# Patient Record
Sex: Male | Born: 1960
Health system: Southern US, Community
[De-identification: ages and names within clinical notes are randomized; demographics above are authoritative.]

## PROBLEM LIST (undated history)

## (undated) DIAGNOSIS — I10 Essential (primary) hypertension: Secondary | ICD-10-CM

## (undated) HISTORY — PX: NO PAST SURGERIES: SHX2092

---

## 2011-06-30 ENCOUNTER — Emergency Department (HOSPITAL_COMMUNITY): Payer: Self-pay

## 2011-06-30 ENCOUNTER — Encounter (HOSPITAL_COMMUNITY): Payer: Self-pay | Admitting: *Deleted

## 2011-06-30 ENCOUNTER — Inpatient Hospital Stay (HOSPITAL_COMMUNITY)
Admission: EM | Admit: 2011-06-30 | Discharge: 2011-07-02 | DRG: 683 | Disposition: A | Discharge status: Home / Self Care | Payer: Self-pay | Attending: Internal Medicine | Admitting: Internal Medicine

## 2011-06-30 DIAGNOSIS — I5022 Chronic systolic (congestive) heart failure: Secondary | ICD-10-CM | POA: Diagnosis present

## 2011-06-30 DIAGNOSIS — R0989 Other specified symptoms and signs involving the circulatory and respiratory systems: Secondary | ICD-10-CM

## 2011-06-30 DIAGNOSIS — R05 Cough: Secondary | ICD-10-CM | POA: Diagnosis present

## 2011-06-30 DIAGNOSIS — R7309 Other abnormal glucose: Secondary | ICD-10-CM | POA: Diagnosis present

## 2011-06-30 DIAGNOSIS — I5023 Acute on chronic systolic (congestive) heart failure: Secondary | ICD-10-CM | POA: Diagnosis present

## 2011-06-30 DIAGNOSIS — N289 Disorder of kidney and ureter, unspecified: Secondary | ICD-10-CM

## 2011-06-30 DIAGNOSIS — I10 Essential (primary) hypertension: Secondary | ICD-10-CM

## 2011-06-30 DIAGNOSIS — R739 Hyperglycemia, unspecified: Secondary | ICD-10-CM | POA: Diagnosis present

## 2011-06-30 DIAGNOSIS — R059 Cough, unspecified: Secondary | ICD-10-CM | POA: Diagnosis present

## 2011-06-30 DIAGNOSIS — R748 Abnormal levels of other serum enzymes: Secondary | ICD-10-CM | POA: Diagnosis present

## 2011-06-30 DIAGNOSIS — E860 Dehydration: Secondary | ICD-10-CM

## 2011-06-30 DIAGNOSIS — Z6841 Body Mass Index (BMI) 40.0 and over, adult: Secondary | ICD-10-CM

## 2011-06-30 DIAGNOSIS — N183 Chronic kidney disease, stage 3 unspecified: Secondary | ICD-10-CM | POA: Diagnosis present

## 2011-06-30 DIAGNOSIS — I509 Heart failure, unspecified: Secondary | ICD-10-CM | POA: Diagnosis present

## 2011-06-30 DIAGNOSIS — E785 Hyperlipidemia, unspecified: Secondary | ICD-10-CM | POA: Diagnosis present

## 2011-06-30 DIAGNOSIS — I129 Hypertensive chronic kidney disease with stage 1 through stage 4 chronic kidney disease, or unspecified chronic kidney disease: Principal | ICD-10-CM | POA: Diagnosis present

## 2011-06-30 HISTORY — DX: Essential (primary) hypertension: I10

## 2011-06-30 LAB — CBC
MCH: 29.3 pg (ref 26.0–34.0)
MCH: 29.2 pg (ref 26.0–34.0)
MCHC: 34.2 g/dL (ref 30.0–36.0)
MCHC: 34 g/dL (ref 30.0–36.0)
Platelets: 154 10*3/uL (ref 150–400)
Platelets: 160 10*3/uL (ref 150–400)
RDW: 14 % (ref 11.5–15.5)
RDW: 13.9 % (ref 11.5–15.5)

## 2011-06-30 LAB — BASIC METABOLIC PANEL
Calcium: 9.5 mg/dL (ref 8.4–10.5)
GFR calc Af Amer: 37 mL/min — ABNORMAL LOW (ref >90)
GFR calc non Af Amer: 32 mL/min — ABNORMAL LOW (ref >90)
Glucose, Bld: 144 mg/dL — ABNORMAL HIGH (ref 70–99)
Sodium: 141 mEq/L (ref 135–145)

## 2011-06-30 LAB — DIFFERENTIAL
Basophils Absolute: 0 10*3/uL (ref 0.0–0.1)
Basophils Relative: 0 % (ref 0–1)
Eosinophils Absolute: 0.2 10*3/uL (ref 0.0–0.7)
Monocytes Relative: 5 % (ref 3–12)
Neutro Abs: 7.8 10*3/uL — ABNORMAL HIGH (ref 1.7–7.7)
Neutrophils Relative %: 72 % (ref 43–77)

## 2011-06-30 LAB — CREATININE, SERUM: Creatinine, Ser: 2.21 mg/dL — ABNORMAL HIGH (ref 0.50–1.35)

## 2011-06-30 LAB — MAGNESIUM: Magnesium: 2 mg/dL (ref 1.5–2.5)

## 2011-06-30 LAB — CARDIAC PANEL(CRET KIN+CKTOT+MB+TROPI): CK, MB: 2.7 ng/mL (ref 0.3–4.0)

## 2011-06-30 MED ORDER — ENALAPRILAT 1.25 MG/ML IV SOLN
1.2500 mg | Freq: Four times a day (QID) | INTRAVENOUS | Status: DC
  Administered 2011-06-30 – 2011-07-01 (×4): 1.25 mg via INTRAVENOUS
  Filled 2011-06-30 (×11): qty 1

## 2011-06-30 MED ORDER — ENOXAPARIN SODIUM 80 MG/0.8ML ~~LOC~~ SOLN
80.0000 mg | SUBCUTANEOUS | Status: DC
  Administered 2011-07-01 – 2011-07-02 (×2): 80 mg via SUBCUTANEOUS
  Filled 2011-06-30 (×2): qty 0.8

## 2011-06-30 MED ORDER — LABETALOL HCL 5 MG/ML IV SOLN
20.0000 mg | INTRAVENOUS | Status: AC
  Administered 2011-06-30: 20 mg via INTRAVENOUS
  Filled 2011-06-30: qty 4

## 2011-06-30 MED ORDER — SUCRALFATE 1 G PO TABS
1.0000 g | ORAL_TABLET | Freq: Three times a day (TID) | ORAL | Status: DC
  Administered 2011-06-30 – 2011-07-02 (×6): 1 g via ORAL
  Filled 2011-06-30 (×9): qty 1

## 2011-06-30 MED ORDER — CLONIDINE HCL 0.1 MG PO TABS
0.1000 mg | ORAL_TABLET | Freq: Three times a day (TID) | ORAL | Status: DC
  Administered 2011-06-30 – 2011-07-02 (×5): 0.1 mg via ORAL
  Filled 2011-06-30 (×7): qty 1

## 2011-06-30 MED ORDER — METOPROLOL TARTRATE 100 MG PO TABS
100.0000 mg | ORAL_TABLET | Freq: Two times a day (BID) | ORAL | Status: DC
  Administered 2011-06-30 – 2011-07-02 (×4): 100 mg via ORAL
  Filled 2011-06-30 (×2): qty 4
  Filled 2011-06-30 (×3): qty 1

## 2011-06-30 MED ORDER — ENOXAPARIN SODIUM 40 MG/0.4ML ~~LOC~~ SOLN: 40.0000 mg | SUBCUTANEOUS | Status: DC

## 2011-06-30 MED ORDER — PANTOPRAZOLE SODIUM 40 MG IV SOLR
40.0000 mg | INTRAVENOUS | Status: DC
  Administered 2011-06-30 – 2011-07-01 (×2): 40 mg via INTRAVENOUS
  Filled 2011-06-30 (×3): qty 40

## 2011-06-30 MED ORDER — SODIUM CHLORIDE 0.9 % IV BOLUS (SEPSIS): 1000.0000 mL | Freq: Once | INTRAVENOUS | Status: DC

## 2011-06-30 NOTE — ED Provider Notes (Signed)
History     CSN: [id number]  Arrival date & time 06/30/11  [id number]   First MD Initiated Contact with Patient 06/30/11 1009      Chief Complaint  Patient presents with  . Cough    x3-68months    (Consider location/radiation/quality/duration/timing/severity/associated sxs/prior treatment) HPI  Pt presents to the Ed with complaints of a cough. Pt states he has had hypertension but has not taken medication in years because he has been unemployed. The patient denies CP, lower extremity edema, fevers, chills, productive cough, weakness. Or any other related symptoms. Denies hx of kidney or heart problems.  Past Medical History  Diagnosis Date  . Hypertension     Past Surgical History  Procedure Date  . No past surgeries     History reviewed. No pertinent family history.  History  Substance Use Topics  . Smoking status: Never Smoker   . Smokeless tobacco: Never Used  . Alcohol Use: Yes     ocassionally      Review of Systems  All other systems reviewed and are negative.    Allergies  Review of patient's allergies indicates no known allergies.  Home Medications   Current Outpatient Rx  Name Route Sig Dispense Refill  . NAPROXEN SODIUM 220 MG PO TABS Oral Take 220 mg by mouth 2 (two) times daily as needed. For pain.      BP 177/122  Pulse 96  Temp(Src) 98.6 F (37 C) (Oral)  Resp 20  Wt 350 lb (158.759 kg)  SpO2 99%  Physical Exam  Nursing note and vitals reviewed. Constitutional: He is oriented to person, place, and time. He appears well-developed and well-nourished.       Obese male  HENT:  Head: Normocephalic and atraumatic.  Eyes: EOM are normal. Pupils are equal, round, and reactive to light.  Neck: Normal range of motion.  Cardiovascular: Normal rate and regular rhythm.   Pulmonary/Chest: Effort normal and breath sounds normal.  Abdominal: He exhibits no distension. There is no rebound.       Abdominal obesity  Musculoskeletal: Normal range of  motion. He exhibits edema (mild LE edema).  Neurological: He is alert and oriented to person, place, and time.  Skin: Skin is warm and dry.    ED Course  Procedures (including critical care time)  Labs Reviewed  CBC - Abnormal; Notable for the following:    RBC 6.10 (*)    Hemoglobin 17.9 (*)    HCT 52.3 (*)    All other components within normal limits  BASIC METABOLIC PANEL - Abnormal; Notable for the following:    Glucose, Bld 144 (*)    BUN 26 (*)    Creatinine, Ser 2.29 (*)    GFR calc non Af Amer 32 (*)    GFR calc Af Amer 37 (*)    All other components within normal limits  PRO B NATRIURETIC PEPTIDE - Abnormal; Notable for the following:    Pro B Natriuretic peptide (BNP) 1068.0 (*)    All other components within normal limits  PRO B NATRIURETIC PEPTIDE   Dg Chest 2 View  06/30/2011  *RADIOLOGY REPORT*  Clinical Data: Persistent cough for 3-4 months  CHEST - 2 VIEW  Comparison: None  Findings: Enlargement of cardiac silhouette. Pulmonary vascular congestion. Mediastinal contours normal. Low lung volumes with minimal bibasilar atelectasis. Minimal bronchitic changes. No infiltrate, pleural effusion or pneumothorax. Multilevel endplate spur formation thoracic spine.  IMPRESSION: Enlargement of cardiac silhouette with pulmonary vascular congestion. Bronchitic changes  with minimal bibasilar atelectasis.  Original Report Authenticated By: Burnetta Sabin, M.D.     1. Uncontrolled hypertension   2. Renal insufficiency   3. Pulmonary vascular congestion   4. Dehydration       MDM  Pts lab work up shows dehydration, renal insufficiency, uncontrolled hypertension.  I have discussed results with patient and that I recommend he be admitted to the hospital for further evaluation and treatment, especially given that he does not have a PCP and no follow-up. He is reluctant but eventually agreed to stay for a day.  Pt has been admitted to Dr. Jackie Plum, inpatient, medical  bed, Team [place], [place] 06/30/11 1626

## 2011-06-30 NOTE — ED Notes (Signed)
Pt states "have had this cough x 4 months, sometimes the mucus is white or brown, have been unemployed since 01/2010 & haven't taken b/p med since then"

## 2011-06-30 NOTE — ED Notes (Signed)
US tech at bedside with patient

## 2011-06-30 NOTE — H&P (Signed)
PCP:  No primary provider on file. Chief Complaint:  " Cough in my throat of about 3 months duration".  HPI:  Patient is a 51 years old morbidly obese African American male with history of hypertension, has not taken his medication in over1-1/2 years because of being unemployed presenting to the emergency room with cough in his throat of about 3 months in duration. Patient claimed to have some feeling of "something stuck in my throat" and this is made worse especially at night or any hours of the morning. He denies any chest pain. He denies any shortness of breath. He denies any palpitation. He denied any nausea or vomiting. He denies any fever, chills or Rigors. The cough was said to be persistently irritating and subsequently was asked by his girlfriend to come to the hospital to be evaluated.   In the emergency room, patient was found to have a blood pressure of 197/168mmhg, but denies any headaches or blurry vision. Chest x-ray showed slight pulmonary vascular congestion. After evaluating patient, he was advised to be admitted for blood pressure stabilization.  Review of Systems:  The patient denies anorexia, fever, weight loss,, vision loss, decreased hearing, hoarseness, chest pain, syncope, dyspnea on exertion, trace peripheral edema, balance deficits, hemoptysis, abdominal pain, melena, hematochezia, severe indigestion/heartburn, hematuria, incontinence, genital sores, muscle weakness, suspicious skin lesions, transient blindness, difficulty walking, depression, unusual weight change, abnormal bleeding, enlarged lymph nodes, angioedema, and breast masses.  Past Medical History:  Past Medical History  Diagnosis Date  . Hypertension     Past Surgical History  Procedure Date  . No past surgeries     Medications:  Prior to Admission medications   Medication Sig Start Date End Date Taking? Authorizing Provider  naproxen sodium (ANAPROX) 220 MG tablet Take 220 mg by mouth 2 (two)  times daily as needed. For pain.   Yes Historical Provider, MD    Allergies:  No Known Allergies  Social History:   reports that he has never smoked. He has never used smokeless tobacco. He reports that he drinks alcohol. He reports that he does not use illicit drugs.  Family History:  History reviewed. No pertinent family history.  Physical Exam:  Filed Vitals:   06/30/11 1008 06/30/11 1138 06/30/11 1713  BP: 195/129 177/122 197/112  Pulse: 94 96   Temp: 97.9 F (36.6 C) 98.6 F (37 C)   TempSrc: Oral Oral   Resp: 20 20   Weight: 158.759 kg (350 lb)    SpO2: 99% 99%       General: Morbidly obese, Alert and oriented times three, well developed and nourished, no acute distress  Eyes: PERRLA, pink conjunctiva, scleral icterus  ENT: Moist oral mucosa, neck supple, no thyromegaly  Lungs: Decreased breath at the lung bases most likely secondary to body habitus  Cardiovascular: regular rate and rhythm, no regurgitation, no gallops, no murmurs. No carotid bruits, no JVD  Abdomen: soft, positive BS, non-tender, non-distended, no organomegaly, not an acute abdomen  GU: not examined  Neuro: Nonfocal  Musculoskeletal: Unremarkable. Trace pedal edema   Skin: no rash, no subcutaneous crepitation, no decubitus  Psych: appropriate patient  ?  Labs on Admission:   [facility] 06/30/11 1220  NA 141  K 4.4  CL 103  CO2 26  GLUCOSE 144*  BUN 26*  CREATININE 2.29*  CALCIUM 9.5  MG --  PHOS --    No results found for this basename: AST:2,ALT:2,ALKPHOS:2,BILITOT:2,PROT:2,ALBUMIN:2 in the last 72 hours  No results found for  this basename: LIPASE:2,AMYLASE:2 in the last 72 hours   Basename 06/30/11 1220  WBC 9.4  NEUTROABS --  HGB 17.9*  HCT 52.3*  MCV 85.7  PLT 154    No results found for this basename: CKTOTAL:3,CKMB:3,CKMBINDEX:3,TROPONINI:3 in the last 72 hours  No results found for this basename: TSH,T4TOTAL,FREET3,T3FREE,THYROIDAB in the last 72  hours  No results found for this basename: VITAMINB12:2,FOLATE:2,FERRITIN:2,TIBC:2,IRON:2,RETICCTPCT:2 in the last 72 hours  Radiological Exams on Admission:  Dg Chest 2 View  06/30/2011  *RADIOLOGY REPORT*  Clinical Data: Persistent cough for 3-4 months  CHEST - 2 VIEW  Comparison: None  Findings: Enlargement of cardiac silhouette. Pulmonary vascular congestion. Mediastinal contours normal. Low lung volumes with minimal bibasilar atelectasis. Minimal bronchitic changes. No infiltrate, pleural effusion or pneumothorax. Multilevel endplate spur formation thoracic spine.  IMPRESSION: Enlargement of cardiac silhouette with pulmonary vascular congestion. Bronchitic changes with minimal bibasilar atelectasis.  Original Report Authenticated By: Burnetta Sabin, M.D.    Assessment/Plan  Present on Admission:   Problems: #1" irritating cough in the throat" #2 morbid obesity #3 uncontrolled hypertension  Impression:  #1 chronic irritating cough in the throat most likely GERD #2 uncontrolled hypertension #3 morbid obesity #4 renal insufficiency most likely secondary to uncontrolled blood pressure(nephrosclerosis)  Plan: #1 admit patient to general medical floor #2 give patient IV Protonix as well as Carafate for GERD #3 control hypertension with IV Vasotec, by mouth Lopressor as well as clonidine #4 GI prophylaxis with Protonix and DVT prophylaxis with Lovenox #5 labs;Cardiac enzyme every 8x3, lipid panel, thyroid panel, repeat CBC, CMP and magnesium in a.m. #6 imaging; 2-D echo, renal ultrasound. Patient will be followed and evaluated on daily basis Jackie Plum    [phone]

## 2011-07-01 DIAGNOSIS — E785 Hyperlipidemia, unspecified: Secondary | ICD-10-CM | POA: Diagnosis present

## 2011-07-01 DIAGNOSIS — I10 Essential (primary) hypertension: Secondary | ICD-10-CM | POA: Diagnosis present

## 2011-07-01 DIAGNOSIS — R739 Hyperglycemia, unspecified: Secondary | ICD-10-CM | POA: Diagnosis present

## 2011-07-01 DIAGNOSIS — R748 Abnormal levels of other serum enzymes: Secondary | ICD-10-CM | POA: Diagnosis present

## 2011-07-01 LAB — CBC
HCT: 49.4 % (ref 39.0–52.0)
Platelets: 162 10*3/uL (ref 150–400)
RBC: 5.74 MIL/uL (ref 4.22–5.81)
RDW: 14.2 % (ref 11.5–15.5)
WBC: 10.2 10*3/uL (ref 4.0–10.5)

## 2011-07-01 LAB — DIFFERENTIAL
Basophils Absolute: 0 10*3/uL (ref 0.0–0.1)
Lymphocytes Relative: 22 % (ref 12–46)
Lymphs Abs: 2.2 10*3/uL (ref 0.7–4.0)
Monocytes Absolute: 0.7 10*3/uL (ref 0.1–1.0)
Neutro Abs: 6.9 10*3/uL (ref 1.7–7.7)

## 2011-07-01 LAB — COMPREHENSIVE METABOLIC PANEL
AST: 27 U/L (ref 0–37)
Albumin: 3.4 g/dL — ABNORMAL LOW (ref 3.5–5.2)
Alkaline Phosphatase: 82 U/L (ref 39–117)
BUN: 29 mg/dL — ABNORMAL HIGH (ref 6–23)
Chloride: 103 mEq/L (ref 96–112)
Potassium: 4.1 mEq/L (ref 3.5–5.1)
Sodium: 138 mEq/L (ref 135–145)
Total Protein: 7.1 g/dL (ref 6.0–8.3)

## 2011-07-01 LAB — TSH: TSH: 1.719 u[IU]/mL (ref 0.350–4.500)

## 2011-07-01 LAB — MAGNESIUM: Magnesium: 2.1 mg/dL (ref 1.5–2.5)

## 2011-07-01 LAB — LIPID PANEL
Cholesterol: 237 mg/dL — ABNORMAL HIGH (ref 0–200)
HDL: 33 mg/dL — ABNORMAL LOW (ref >39)
Total CHOL/HDL Ratio: 7.2 RATIO

## 2011-07-01 LAB — CARDIAC PANEL(CRET KIN+CKTOT+MB+TROPI)
CK, MB: 2.6 ng/mL (ref 0.3–4.0)
Relative Index: 1 (ref 0.0–2.5)
Total CK: 265 U/L — ABNORMAL HIGH (ref 7–232)

## 2011-07-01 MED ORDER — FUROSEMIDE 40 MG PO TABS
40.0000 mg | ORAL_TABLET | Freq: Every day | ORAL | Status: DC
  Administered 2011-07-01 – 2011-07-02 (×2): 40 mg via ORAL
  Filled 2011-07-01 (×2): qty 1

## 2011-07-01 MED ORDER — LISINOPRIL 20 MG PO TABS
20.0000 mg | ORAL_TABLET | Freq: Every day | ORAL | Status: DC
  Administered 2011-07-01 – 2011-07-02 (×2): 20 mg via ORAL
  Filled 2011-07-01 (×2): qty 1

## 2011-07-01 MED ORDER — HYDROCHLOROTHIAZIDE 25 MG PO TABS: 25.0000 mg | ORAL_TABLET | Freq: Every day | ORAL | Status: DC

## 2011-07-01 NOTE — ED Notes (Signed)
Attempted to call receiving rn to give report. rn at lunch will return phone call

## 2011-07-01 NOTE — Progress Notes (Signed)
PATIENT DETAILS Name: Edward Jordan Age: 51 y.o. Sex: male Date of Birth: 10/10/1960 Admit Date: 06/30/2011 PCP:No primary provider on file. Emergency contact:   CONSULTS: None  Interval History: Edward Jordan is a 51 year old male with a past medical history of hypertension. He has not been able to afford his medications and therefore has not been taking any antihypertensives. He presented to the hospital on 06/30/2011 with a chief complaint of cough. His blood pressure was discovered to be 197/112, which prompted his admission to the hospital.  ROS: Edward Jordan currently is denying any problems with shortness of breath, chest pain or tightness, or ongoing issues with cough.   Objective: Vital signs in last 24 hours: Temp:  [97.9 F (36.6 C)-98.5 F (36.9 C)] 97.9 F (36.6 C) (01/17 0530) Pulse Rate:  [68-87] 68  (01/17 1017) Resp:  [20-22] 20  (01/17 0530) BP: (144-168)/(95-105) 144/95 mmHg (01/17 1017) SpO2:  [95 %-100 %] 100 % (01/17 1017) Weight change:     Intake/Output from previous day:  Intake/Output Summary (Last 24 hours) at 07/01/11 1853 Last data filed at 07/01/11 1720  Gross per 24 hour  Intake    480 ml  Output    200 ml  Net    280 ml     Physical Exam:  Gen:  NAD Cardiovascular:  RRR, No M/R/G Respiratory: Lungs with bibasilar crackles. Gastrointestinal: Abdomen soft, NT/ND with normal active bowel sounds. Extremities: No C/E/C    Lab Results: Basic Metabolic Panel:  Lab [id number] [id number] 06/30/11 2220 06/30/11 1220  NA 138 -- 141  K 4.1 -- 4.4  CL 103 -- 103  CO2 22 -- 26  GLUCOSE 138* -- 144*  BUN 29* -- 26*  CREATININE 2.33* 2.21* 2.29*  CALCIUM 9.2 -- 9.5  MG 2.1 2.0 --  PHOS -- -- --   GFR CrCl is unknown because there is no height on file for the current visit. Liver Function Tests:  Lab 07/01/11 0506  AST 27  ALT 31  ALKPHOS 82  BILITOT 0.7  PROT 7.1  ALBUMIN 3.4*    CBC:  Lab 07/01/11 0506 06/30/11 2220 06/30/11 1220  WBC 10.2  10.9* 9.4  NEUTROABS 6.9 7.8* --  HGB 16.6 17.1* 17.9*  HCT 49.4 50.3 52.3*  MCV 86.1 85.8 85.7  PLT 162 160 154   Cardiac Enzymes:  Lab 07/01/11 1414 07/01/11 0507 06/30/11 2221  CKTOTAL 385* 265* 234*  CKMB 3.1 2.6 2.7  CKMBINDEX -- -- --  TROPONINI <0.30 <0.30 <0.30   Lipid Profile  Basename 07/01/11 0507  CHOL 237*  HDL 33*  LDLCALC 169*  TRIG 177*  CHOLHDL 7.2  LDLDIRECT --   Thyroid function studies  Basename 06/30/11 2220  TSH 1.719  T4TOTAL --  T3FREE --  THYROIDAB --    Studies/Results: Dg Chest 2 View  06/30/2011  *RADIOLOGY REPORT*  Clinical Data: Persistent cough for 3-4 months  CHEST - 2 VIEW  Comparison: None  Findings: Enlargement of cardiac silhouette. Pulmonary vascular congestion. Mediastinal contours normal. Low lung volumes with minimal bibasilar atelectasis. Minimal bronchitic changes. No infiltrate, pleural effusion or pneumothorax. Multilevel endplate spur formation thoracic spine.  IMPRESSION: Enlargement of cardiac silhouette with pulmonary vascular congestion. Bronchitic changes with minimal bibasilar atelectasis.  Original Report Authenticated By: Burnetta Sabin, M.D.   US Renal  06/30/2011  *RADIOLOGY REPORT*  Clinical Data: Hypertension  RENAL/URINARY TRACT ULTRASOUND COMPLETE  Comparison:  None.  Findings:  Limited exam because of patient body habitus, weight 350  pounds  Right Kidney:  12.8 cm length.  No hydronephrosis.  Slight increased echogenicity, can be seen with medical renal disease. This is nonspecific.  Small renal cyst noted in the lower pole measuring 17 mm less in size.  Left Kidney:  11.8 cm length.  No hydronephrosis.  Limited visualization.  Slight increase echogenicity.  Bladder:  Bladder decompressed  IMPRESSION: Negative hydronephrosis.  Very limited study.  Probable tiny right renal cysts  Original Report Authenticated By: Jerilynn Mages. Daryll Brod, M.D.    Medications: Scheduled Meds:    . cloNIDine  0.1 mg Oral TID  . enoxaparin   80 mg Subcutaneous Q24H  . furosemide  40 mg Oral Daily  . lisinopril  20 mg Oral Daily  . metoprolol tartrate  100 mg Oral BID  . pantoprazole (PROTONIX) IV  40 mg Intravenous Q24H  . sucralfate  1 g Oral TID WC & HS  . DISCONTD: enalaprilat  1.25 mg Intravenous Q6H  . DISCONTD: enoxaparin  40 mg Subcutaneous Q24H  . DISCONTD: hydrochlorothiazide  25 mg Oral Daily   Continuous Infusions:  PRN Meds:. Antibiotics: Anti-infectives    None       Assessment/Plan:  Principal Problem:  *Accelerated hypertension The patient was admitted and put on IV Vasotec, clonidine, and metoprolol. This has brought his blood pressure down to the 140s over 90s range. Since cost factors appear to be critical for this patient, I will switch him over to oral medications that are available for $4 per month at the [facility]. We'll therefore change his Vasotec to by mouth lisinopril, continue metoprolol, continue clonidine, and add Lasix. Given his severe hypertension and elevated CK levels, I will check a urine drug screen to rule out cocaine abuse. Followup two-dimensional echocardiogram. Active Problems:  CKD (chronic kidney disease) stage 3, GFR 30-59 ml/min Patient's renal function is likely a reflection of his severe uncontrolled hypertension. He has not had any appreciable improvement in his renal function with control of his blood pressure which suggests that this is likely his baseline. We will need a referral to a nephrologist for ongoing evaluation of his chronic kidney disease  Morbid obesity We'll asked the dietitian to see evaluate him and provide him with dietary instruction.  Hyperglycemia Patient is at high-risk for diabetes given his morbid obesity. Check hemoglobin A1c.  Elevated CK Unclear etiology, but concerning for cocaine abuse. We'll recheck a level in the morning.  Hyperlipidemia Would not initiate therapy with a statin until his CK values normalized. In the meantime, we will  ask the dietitian to provide him with dietary instructions. He is currently on a heart healthy diet.    LOS: 1 day   Jacquelynn Cree, MD Pager [phone]  07/01/2011, 6:53 PM

## 2011-07-01 NOTE — Progress Notes (Signed)
  Echocardiogram 2D Echocardiogram has been performed.  Diamond Nickel Deneen 07/01/2011, 4:20 PM

## 2011-07-01 NOTE — Progress Notes (Signed)
ED CM noted no pcp self pay pt. CM spoke with pt who confirms he does not have pcp & is self pay. CM reviewed and provided [place] self pay providers, medication resources and other [facility] resources.  Pt agreed to a referral to [facility].

## 2011-07-01 NOTE — ED Provider Notes (Signed)
Medical screening examination/treatment/procedure(s) were conducted as a shared visit with non-physician practitioner(s) and myself.  I personally evaluated the patient during the encounter  Pt w uncontrolled htn, renal insuff (no old labs to compare), new onset mild chf. Labs. Admit.   Mirna Mires, MD 07/01/11 [phone]

## 2011-07-02 DIAGNOSIS — I5023 Acute on chronic systolic (congestive) heart failure: Secondary | ICD-10-CM | POA: Diagnosis present

## 2011-07-02 LAB — HEMOGLOBIN A1C
Hgb A1c MFr Bld: 6.6 % — ABNORMAL HIGH (ref <5.7)
Mean Plasma Glucose: 143 mg/dL — ABNORMAL HIGH (ref <117)

## 2011-07-02 LAB — CK: Total CK: 430 U/L — ABNORMAL HIGH (ref 7–232)

## 2011-07-02 LAB — BASIC METABOLIC PANEL
Chloride: 101 mEq/L (ref 96–112)
GFR calc Af Amer: 32 mL/min — ABNORMAL LOW (ref >90)
Potassium: 4.6 mEq/L (ref 3.5–5.1)

## 2011-07-02 LAB — RAPID URINE DRUG SCREEN, HOSP PERFORMED
Benzodiazepines: NOT DETECTED
Cocaine: NOT DETECTED
Opiates: NOT DETECTED

## 2011-07-02 MED ORDER — ENALAPRIL MALEATE 20 MG PO TABS: 20.0000 mg | ORAL_TABLET | Freq: Every day | ORAL | Status: DC

## 2011-07-02 MED ORDER — METOPROLOL TARTRATE 100 MG PO TABS: 100.0000 mg | ORAL_TABLET | Freq: Two times a day (BID) | ORAL | Status: DC

## 2011-07-02 MED ORDER — PANTOPRAZOLE SODIUM 40 MG PO TBEC: 40.0000 mg | DELAYED_RELEASE_TABLET | Freq: Every day | ORAL | Status: DC

## 2011-07-02 MED ORDER — CLONIDINE HCL 0.1 MG PO TABS: 0.2000 mg | ORAL_TABLET | Freq: Two times a day (BID) | ORAL | Status: DC

## 2011-07-02 MED ORDER — FUROSEMIDE 40 MG PO TABS: 20.0000 mg | ORAL_TABLET | Freq: Every day | ORAL | Status: DC

## 2011-07-02 MED ORDER — PRAVASTATIN SODIUM 40 MG PO TABS: 40.0000 mg | ORAL_TABLET | Freq: Every day | ORAL | Status: DC

## 2011-07-02 NOTE — Discharge Summary (Signed)
Physician Discharge Summary  Patient ID: Edward Jordan MRN: [id number] DOB/AGE: 49-Jan-1962 49 y.o.  Admit date: 06/30/2011 Discharge date: 07/02/2011  Primary Care Physician:  No primary provider on file.  The patient is being referred to [facility] (Dr. Rosezetta Jordan)   Discharge Diagnoses:    Present on Admission:  .Accelerated hypertension .CKD (chronic kidney disease) stage 3, GFR 30-59 ml/min .Morbid obesity .Hyperglycemia .Elevated CK .Hyperlipidemia .Systolic CHF, chronic  Discharge Medications:  Current Discharge Medication List    START taking these medications   Details  cloNIDine (CATAPRES) 0.1 MG tablet Take 2 tablets (0.2 mg total) by mouth 2 (two) times daily. Qty: 60 tablet, Refills: 3    enalapril (VASOTEC) 20 MG tablet Take 1 tablet (20 mg total) by mouth daily. Qty: 30 tablet, Refills: 3    furosemide (LASIX) 40 MG tablet Take 0.5 tablets (20 mg total) by mouth daily. Qty: 30 tablet, Refills: 3    metoprolol (LOPRESSOR) 100 MG tablet Take 1 tablet (100 mg total) by mouth 2 (two) times daily. Qty: 60 tablet, Refills: 3    pravastatin (PRAVACHOL) 40 MG tablet Take 1 tablet (40 mg total) by mouth daily. Qty: 30 tablet, Refills: 3      STOP taking these medications     naproxen sodium (ANAPROX) 220 MG tablet          Disposition and Follow-up: The patient is being discharged home.  He has follow up scheduled at [facility] on 07/20/11 with Dr. Rosezetta Jordan at 10:30 am.    Consults: None  Significant Diagnostic Studies:  Dg Chest 2 View  06/30/2011  *RADIOLOGY REPORT*  Clinical Data: Persistent cough for 3-4 months  CHEST - 2 VIEW  Comparison: None  Findings: Enlargement of cardiac silhouette. Pulmonary vascular congestion. Mediastinal contours normal. Low lung volumes with minimal bibasilar atelectasis. Minimal bronchitic changes. No infiltrate, pleural effusion or pneumothorax. Multilevel endplate spur formation thoracic spine.  IMPRESSION: Enlargement of cardiac  silhouette with pulmonary vascular congestion. Bronchitic changes with minimal bibasilar atelectasis.  Original Report Authenticated By: Burnetta Sabin, M.D.   US Renal  06/30/2011  *RADIOLOGY REPORT*  Clinical Data: Hypertension  RENAL/URINARY TRACT ULTRASOUND COMPLETE  Comparison:  None.  Findings:  Limited exam because of patient body habitus, weight 350 pounds  Right Kidney:  12.8 cm length.  No hydronephrosis.  Slight increased echogenicity, can be seen with medical renal disease. This is nonspecific.  Small renal cyst noted in the lower pole measuring 17 mm less in size.  Left Kidney:  11.8 cm length.  No hydronephrosis.  Limited visualization.  Slight increase echogenicity.  Bladder:  Bladder decompressed  IMPRESSION: Negative hydronephrosis.  Very limited study.  Probable tiny right renal cysts  Original Report Authenticated By: Jerilynn Mages. Daryll Brod, M.D.   2 D Echocardiogram  07/01/11  Study Conclusions  - Left ventricle: The cavity size was normal. Systolic function was moderately reduced. The estimated ejection fraction was in the range of 35% to 40%. Diffuse hypokinesis. - Left atrium: The atrium was mildly to moderately dilated.      Discharge Laboratory Values: Basic Metabolic Panel:  Lab [id number] 07/01/11 0506 06/30/11 2220 06/30/11 1220  NA 136 138 -- 141  K 4.6 4.1 -- --  CL 101 103 -- 103  CO2 26 22 -- 26  GLUCOSE 141* 138* -- 144*  BUN 35* 29* -- 26*  CREATININE 2.61* 2.33* 2.21* 2.29*  CALCIUM 9.2 9.2 -- 9.5  MG -- 2.1 2.0 --  PHOS -- -- -- --  GFR Estimated Creatinine Clearance: 53.9 ml/min (by C-G formula based on Cr of 2.61). Liver Function Tests:  Lab 07/01/11 0506  AST 27  ALT 31  ALKPHOS 82  BILITOT 0.7  PROT 7.1  ALBUMIN 3.4*    CBC:  Lab 07/01/11 0506 06/30/11 2220 06/30/11 1220  WBC 10.2 10.9* 9.4  NEUTROABS 6.9 7.8* --  HGB 16.6 17.1* 17.9*  HCT 49.4 50.3 52.3*  MCV 86.1 85.8 85.7  PLT 162 160 154   Cardiac Enzymes:  Lab 07/02/11  0520 07/01/11 1414 07/01/11 0507 06/30/11 2221  CKTOTAL 430* 385* 265* 234*  CKMB -- 3.1 2.6 2.7  CKMBINDEX -- -- -- --  TROPONINI -- <0.30 <0.30 <0.30   BNP:   Ref. Range 06/30/2011 13:35  Pro B Natriuretic peptide (BNP) Latest Range: 0-125 pg/mL 1068.0 (H)   Lipid Profile  Basename 07/01/11 0507  CHOL 237*  HDL 33*  LDLCALC 169*  TRIG 177*  CHOLHDL 7.2  LDLDIRECT --   Thyroid function studies  Basename 06/30/11 2220  TSH 1.719  T4TOTAL --  T3FREE --  THYROIDAB --     Brief H and P: For complete details please refer to admission H and P, but in brief, Edward Jordan is a 51 year old male with a past medical history of hypertension. He had not been able to afford his medications and therefore had not been taking any antihypertensives. He presented to the hospital on 06/30/2011 with a chief complaint of cough. His blood pressure was discovered to be 197/112, which prompted his admission to the hospital.   Physical Exam at Discharge: BP 133/92  Pulse 67  Temp(Src) 98.1 F (36.7 C) (Oral)  Resp 18  Ht 6' (1.829 m)  Wt 158.759 kg (350 lb)  BMI 47.47 kg/m2  SpO2 98%  Gen:  NAD, morbidly obese Cardiovascular:  RRR, No M/R/G Respiratory: Lungs CTAB Gastrointestinal: Abdomen soft, NT/ND with normal active bowel sounds. Extremities: No C/E/C     Hospital Course:  Principal Problem:  *Accelerated hypertension / Chronic systolic CHF The patient was admitted and put on IV Vasotec, clonidine, and metoprolol. This has brought his blood pressure down to the 140s over 90s range. Since cost factors appeared to be critical for this patient, I switched him over to oral medications that are available for $4 per month at the [facility].  Given his severe hypertension and elevated CK levels, I checked a urine drug screen to rule out cocaine abuse, and his drug screen was negative.  Two-dimensional echocardiogram showed a reduced EF, and the patient was counseled regarding the  importance of BP management to prevent overt heart failure.  Active Problems:  CKD (chronic kidney disease) stage 3, GFR 30-59 ml/min  Patient's renal function was felt to be a reflection of his severe uncontrolled hypertension. He had not had any appreciable improvement in his renal function with control of his blood pressure which suggests that this is likely his baseline. He will need an outpatient referral to a nephrologist for ongoing evaluation of his chronic kidney disease  Morbid obesity  We asked the dietitian to see evaluate him and provide him with dietary instruction. The consultation and education was done on 07/02/11. Hyperglycemia  Patient is at high-risk for diabetes given his morbid obesity. We checked hemoglobin A1c, but it is pending at the time of this dictation. Elevated CK  Unclear etiology, but UDS was negative for cocaine abuse.  Hyperlipidemia  The patient was discharged on a statin.  Would re-check CK levels  upon follow up to ensure they have normalized.     Recommendations for hospital follow-up: 1.  Recheck CK, LFTs, FLP in 4 weeks. 2.  Consider referral to cardiologist for chronic systolic CHF (newly diagnosed). 3.  Consider referral to nephrologist for stage 3 CKD. 4.  Follow up results of hemoglobin A1c.  Diet:  Heart healthy, low sodium  Activity:  Increase activity slowly  Condition at Discharge:   Improved  Time spent on Discharge:  35 minutes.  Signed: Dr. Margreta Journey Rama Pager [phone] 07/02/2011, 1:26 PM   /

## 2011-07-02 NOTE — Progress Notes (Signed)
Pharmacy Note: IV Protonix changed to PO route per hospital protocol.  Clayburn Pert, Pharm.D.  [id number] 07/02/2011 1:14 PM

## 2011-07-02 NOTE — Progress Notes (Signed)
Patient discharged home, alert and oriented, discharge instructions given, patient in stable condition at this time

## 2011-07-02 NOTE — Progress Notes (Signed)
Nutrition Education Note  - Pt reports wanting to eat healthier as a new year's resolution. Pt admits to eating out a lot and not doing a lot of cooking. Pt reports drinking water and occasionally soda. Discussed healthy eating with a low fat diet with an emphasis on fiber, fruits, and vegetables, and low sodium food choices. Healthy eating handout provided.   Nutrition dx:  Nutrition-related knowledge deficit r/t diet therapy AEB MD consult  Intervention:  Brief education;  Provided.  Goals of nutrition therapy discussed.  Understanding confirmed.  RD contact information provided.  Monitoring:  Knowledge; for questions.  Please consult RD if new questions present.  Pager: [phone]

## 2012-10-29 ENCOUNTER — Emergency Department (HOSPITAL_COMMUNITY): Payer: Self-pay

## 2012-10-29 ENCOUNTER — Emergency Department (HOSPITAL_COMMUNITY)
Admission: EM | Admit: 2012-10-29 | Discharge: 2012-10-29 | Disposition: A | Discharge status: Home / Self Care | Payer: Self-pay | Attending: Emergency Medicine | Admitting: Emergency Medicine

## 2012-10-29 ENCOUNTER — Encounter (HOSPITAL_COMMUNITY): Payer: Self-pay

## 2012-10-29 DIAGNOSIS — X58XXXA Exposure to other specified factors, initial encounter: Secondary | ICD-10-CM | POA: Insufficient documentation

## 2012-10-29 DIAGNOSIS — Y939 Activity, unspecified: Secondary | ICD-10-CM | POA: Insufficient documentation

## 2012-10-29 DIAGNOSIS — S39012A Strain of muscle, fascia and tendon of lower back, initial encounter: Secondary | ICD-10-CM

## 2012-10-29 DIAGNOSIS — Z79899 Other long term (current) drug therapy: Secondary | ICD-10-CM | POA: Insufficient documentation

## 2012-10-29 DIAGNOSIS — S335XXA Sprain of ligaments of lumbar spine, initial encounter: Secondary | ICD-10-CM | POA: Insufficient documentation

## 2012-10-29 DIAGNOSIS — Y929 Unspecified place or not applicable: Secondary | ICD-10-CM | POA: Insufficient documentation

## 2012-10-29 DIAGNOSIS — N289 Disorder of kidney and ureter, unspecified: Secondary | ICD-10-CM | POA: Insufficient documentation

## 2012-10-29 LAB — POCT I-STAT, CHEM 8
Chloride: 109 mEq/L (ref 96–112)
HCT: 52 % (ref 39.0–52.0)
Hemoglobin: 17.7 g/dL — ABNORMAL HIGH (ref 13.0–17.0)
Potassium: 4.3 mEq/L (ref 3.5–5.1)
Sodium: 142 mEq/L (ref 135–145)

## 2012-10-29 LAB — URINALYSIS, ROUTINE W REFLEX MICROSCOPIC
Leukocytes, UA: NEGATIVE
Nitrite: NEGATIVE
Specific Gravity, Urine: 1.02 (ref 1.005–1.030)
Urobilinogen, UA: 0.2 mg/dL (ref 0.0–1.0)
pH: 5 (ref 5.0–8.0)

## 2012-10-29 LAB — URINE MICROSCOPIC-ADD ON: Urine-Other: NONE SEEN

## 2012-10-29 MED ORDER — IBUPROFEN 800 MG PO TABS: 800.0000 mg | ORAL_TABLET | Freq: Three times a day (TID) | ORAL | Status: DC

## 2012-10-29 MED ORDER — CYCLOBENZAPRINE HCL 10 MG PO TABS: 10.0000 mg | ORAL_TABLET | Freq: Two times a day (BID) | ORAL | Status: DC | PRN

## 2012-10-29 MED ORDER — OXYCODONE-ACETAMINOPHEN 5-325 MG PO TABS
2.0000 | ORAL_TABLET | Freq: Once | ORAL | Status: AC
  Administered 2012-10-29: 2 via ORAL
  Filled 2012-10-29: qty 2

## 2012-10-29 MED ORDER — OXYCODONE-ACETAMINOPHEN 5-325 MG PO TABS
2.0000 | ORAL_TABLET | ORAL | Status: AC | PRN
Start: 2012-10-29 — End: ?

## 2012-10-29 MED ORDER — KETOROLAC TROMETHAMINE 60 MG/2ML IM SOLN
60.0000 mg | Freq: Once | INTRAMUSCULAR | Status: AC
  Administered 2012-10-29: 60 mg via INTRAMUSCULAR
  Filled 2012-10-29: qty 2

## 2012-10-29 NOTE — ED Notes (Signed)
Patient transported to CT 

## 2012-10-29 NOTE — ED Notes (Signed)
He also states he has been having normal bowel movements, but he has seen some blood in the last 2 stools he has passed.  He states he has remote hx of hemorrhoid which was amenable to o.t.c. Topical treatment. He denies any urinary difficulties, and states the color of his urine has been "normal".

## 2012-10-29 NOTE — ED Provider Notes (Signed)
History     CSN: [id number]  Arrival date & time 10/29/12  1430   First MD Initiated Contact with Patient 10/29/12 1526      Chief Complaint  Patient presents with  . Back Pain    (Consider location/radiation/quality/duration/timing/severity/associated sxs/prior treatment) HPI Comments: Patient presents with a two-day history of left-sided low back pain that does not radiate. Denies any trauma. Pain is constant nothing makes it better or worse. He has tried biofreeze without relief. Denies any previous back problems. Denies any weakness, numbness, tingling, bowel bladder incontinence, fevers or vomiting. Denies any testicle pain, dysuria hematuria. Denies any history of cancer or IV drug use.  The history is provided by the patient.    Past Medical History  Diagnosis Date  . Hypertension     Past Surgical History  Procedure Laterality Date  . No past surgeries      History reviewed. No pertinent family history.  History  Substance Use Topics  . Smoking status: Never Smoker   . Smokeless tobacco: Never Used  . Alcohol Use: Yes     Comment: ocassionally      Review of Systems  Constitutional: Negative for fever, activity change and appetite change.  HENT: Negative for congestion and rhinorrhea.   Respiratory: Negative for cough, chest tightness and shortness of breath.   Cardiovascular: Negative for chest pain.  Gastrointestinal: Negative for nausea, vomiting and abdominal pain.  Genitourinary: Negative for dysuria, hematuria and testicular pain.  Musculoskeletal: Positive for back pain.  Skin: Negative for rash.  Neurological: Negative for dizziness, weakness and headaches.  A complete 10 system review of systems was obtained and all systems are negative except as noted in the HPI and PMH.    Allergies  Review of patient's allergies indicates no known allergies.  Home Medications   Current Outpatient Rx  Name  Route  Sig  Dispense  Refill  . cloNIDine  (CATAPRES) 0.2 MG tablet   Oral   Take 0.2 mg by mouth 3 (three) times daily.         . enalapril (VASOTEC) 20 MG tablet   Oral   Take 1 tablet (20 mg total) by mouth daily.   30 tablet   3   . furosemide (LASIX) 20 MG tablet   Oral   Take 20 mg by mouth 2 (two) times daily.         . hydrALAZINE (APRESOLINE) 50 MG tablet   Oral   Take 50 mg by mouth 3 (three) times daily.         . Menthol, Topical Analgesic, (BIOFREEZE) 4 % GEL   Apply externally   Apply 1 application topically daily as needed (for back pain).         Marland Kitchen metolazone (ZAROXOLYN) 2.5 MG tablet   Oral   Take 2.5 mg by mouth every morning.         . metoprolol (LOPRESSOR) 100 MG tablet   Oral   Take 1 tablet (100 mg total) by mouth 2 (two) times daily.   60 tablet   3   . pravastatin (PRAVACHOL) 40 MG tablet   Oral   Take 1 tablet (40 mg total) by mouth daily.   30 tablet   3     Take in evening.     BP 185/99  Pulse 102  Temp(Src) 98 F (36.7 C) (Oral)  Resp 18  SpO2 98%  Physical Exam  Constitutional: He is oriented to person, place, and time. He  appears well-developed and well-nourished. No distress.  Morbidly obese, uncomfortable  HENT:  Head: Normocephalic and atraumatic.  Mouth/Throat: Oropharynx is clear and moist. No oropharyngeal exudate.  Eyes: Conjunctivae and EOM are normal. Pupils are equal, round, and reactive to light.  Neck: Normal range of motion. Neck supple.  Cardiovascular: Normal rate, regular rhythm and normal heart sounds.   No murmur heard. Pulmonary/Chest: Effort normal and breath sounds normal. No respiratory distress.  Abdominal: Soft. There is no tenderness. There is no rebound and no guarding.  Musculoskeletal: Normal range of motion. He exhibits tenderness. He exhibits no edema.  Left paraspinal lumbar tenderness, no midline tenderness  Neurological: He is alert and oriented to person, place, and time. No cranial nerve deficit. He exhibits normal  muscle tone. Coordination normal.  5/5 strength in bilateral lower extremities. Ankle plantar and dorsiflexion intact. Great toe extension intact bilaterally. +2 DP and PT pulses. Unable to obtain patellar reflexes. Gait normal.  Skin: Skin is warm.    ED Course  Procedures (including critical care time)  Labs Reviewed  URINALYSIS, ROUTINE W REFLEX MICROSCOPIC - Abnormal; Notable for the following:    Protein, ur 100 (*)    All other components within normal limits  POCT I-STAT, CHEM 8 - Abnormal; Notable for the following:    BUN 56 (*)    Creatinine, Ser 3.00 (*)    Glucose, Bld 176 (*)    Hemoglobin 17.7 (*)    All other components within normal limits  URINE MICROSCOPIC-ADD ON   Ct Abdomen Pelvis Wo Contrast  10/29/2012   *RADIOLOGY REPORT*  Clinical Data: Severe left flank and back pain.  Renal insufficiency.  CT ABDOMEN AND PELVIS WITHOUT CONTRAST  Technique:  Multidetector CT imaging of the abdomen and pelvis was performed following the standard protocol without intravenous contrast.  Comparison: None.  Findings: No evidence of renal calculi or hydronephrosis.  No evidence of ureteral calculi or dilatation.  No bladder calculi identified.  The mild right renal atrophy is noted as well as a small low attenuation cystic lesion in the posterior interpolar region of the right kidney.  No evidence of perinephric fluid or inflammatory changes.  Cholelithiasis is demonstrated, however there is no evidence of acute cholecystitis or biliary dilatation.  Noncontrast images of the liver, spleen, pancreas, and adrenal glands are normal in appearance.  No soft tissue masses or lymphadenopathy identified. No evidence of inflammatory process or abnormal fluid collections. No evidence of dilated bowel loops or hernia.  IMPRESSION:  1.  No acute findings.  No evidence of urolithiasis or hydronephrosis. 2. Cholelithiasis.  No radiographic evidence of cholecystitis.   Original Report Authenticated By: Earle Gell, M.D.     No diagnosis found.    MDM  Atraumatic left-sided low back pain. No radiation. No urinary symptoms. Likely musculoskeletal, will obtain urinalysis to rule out UTI and kidney stone.  CT scan shows no evidence of kidney stone. Urinalysis is negative. There is no infection or hematuria.  Patient's creatinine has worsened to 3 from 2.5 range last year. He has a primary care doctor. Suspect he has kidney disease from his hypertension.  His back pain appears musculoskeletal. No focal neurological deficits. He is ambulatory with normal gait. He is informed of his worsening creatinine. We'll treat with anti-inflammatories, analgesics and followup with PCP.      Ezequiel Essex, MD 10/29/12 2330

## 2012-10-29 NOTE — ED Notes (Signed)
He c/o left-sided lower back pain which is "making too painful for me to walk" x 2 days.  He denies fever, nor any other sign of recent illness.

## 2012-10-29 NOTE — ED Notes (Signed)
Patient told he cannot stay in wheelchair and that he needs to get into a gown. Large gown provided.

## 2012-12-19 ENCOUNTER — Ambulatory Visit: Payer: Self-pay

## 2012-12-19 ENCOUNTER — Other Ambulatory Visit: Payer: Self-pay | Admitting: Occupational Medicine

## 2012-12-19 DIAGNOSIS — Z021 Encounter for pre-employment examination: Secondary | ICD-10-CM

## 2016-04-19 ENCOUNTER — Inpatient Hospital Stay (HOSPITAL_COMMUNITY)
Admission: EM | Admit: 2016-04-19 | Discharge: 2016-04-24 | DRG: 291 | Disposition: A | Discharge status: Home / Self Care | Payer: Self-pay | Attending: Internal Medicine | Admitting: Internal Medicine

## 2016-04-19 ENCOUNTER — Emergency Department (HOSPITAL_COMMUNITY): Payer: Self-pay

## 2016-04-19 ENCOUNTER — Encounter (HOSPITAL_COMMUNITY): Payer: Self-pay | Admitting: Emergency Medicine

## 2016-04-19 DIAGNOSIS — N183 Chronic kidney disease, stage 3 unspecified: Secondary | ICD-10-CM | POA: Diagnosis present

## 2016-04-19 DIAGNOSIS — I10 Essential (primary) hypertension: Secondary | ICD-10-CM | POA: Diagnosis present

## 2016-04-19 DIAGNOSIS — D631 Anemia in chronic kidney disease: Secondary | ICD-10-CM | POA: Diagnosis present

## 2016-04-19 DIAGNOSIS — N189 Chronic kidney disease, unspecified: Secondary | ICD-10-CM

## 2016-04-19 DIAGNOSIS — I5021 Acute systolic (congestive) heart failure: Secondary | ICD-10-CM | POA: Diagnosis present

## 2016-04-19 DIAGNOSIS — E875 Hyperkalemia: Secondary | ICD-10-CM | POA: Diagnosis present

## 2016-04-19 DIAGNOSIS — E785 Hyperlipidemia, unspecified: Secondary | ICD-10-CM | POA: Diagnosis present

## 2016-04-19 DIAGNOSIS — Z6841 Body Mass Index (BMI) 40.0 and over, adult: Secondary | ICD-10-CM

## 2016-04-19 DIAGNOSIS — Z9111 Patient's noncompliance with dietary regimen: Secondary | ICD-10-CM

## 2016-04-19 DIAGNOSIS — Z9114 Patient's other noncompliance with medication regimen: Secondary | ICD-10-CM

## 2016-04-19 DIAGNOSIS — Z79899 Other long term (current) drug therapy: Secondary | ICD-10-CM

## 2016-04-19 DIAGNOSIS — E872 Acidosis: Secondary | ICD-10-CM | POA: Diagnosis present

## 2016-04-19 DIAGNOSIS — I132 Hypertensive heart and chronic kidney disease with heart failure and with stage 5 chronic kidney disease, or end stage renal disease: Principal | ICD-10-CM | POA: Diagnosis present

## 2016-04-19 DIAGNOSIS — I161 Hypertensive emergency: Secondary | ICD-10-CM

## 2016-04-19 DIAGNOSIS — N179 Acute kidney failure, unspecified: Secondary | ICD-10-CM | POA: Diagnosis present

## 2016-04-19 DIAGNOSIS — G4733 Obstructive sleep apnea (adult) (pediatric): Secondary | ICD-10-CM | POA: Diagnosis present

## 2016-04-19 DIAGNOSIS — I5043 Acute on chronic combined systolic (congestive) and diastolic (congestive) heart failure: Secondary | ICD-10-CM | POA: Diagnosis present

## 2016-04-19 DIAGNOSIS — N185 Chronic kidney disease, stage 5: Secondary | ICD-10-CM | POA: Diagnosis present

## 2016-04-19 DIAGNOSIS — E662 Morbid (severe) obesity with alveolar hypoventilation: Secondary | ICD-10-CM | POA: Diagnosis present

## 2016-04-19 DIAGNOSIS — I429 Cardiomyopathy, unspecified: Secondary | ICD-10-CM | POA: Diagnosis present

## 2016-04-19 DIAGNOSIS — I509 Heart failure, unspecified: Secondary | ICD-10-CM

## 2016-04-19 LAB — CREATININE, SERUM
Creatinine, Ser: 6.24 mg/dL — ABNORMAL HIGH (ref 0.61–1.24)
GFR calc Af Amer: 11 mL/min — ABNORMAL LOW (ref >60)
GFR, EST NON AFRICAN AMERICAN: 9 mL/min — AB (ref >60)

## 2016-04-19 LAB — I-STAT TROPONIN, ED: Troponin i, poc: 0.05 ng/mL (ref 0.00–0.08)

## 2016-04-19 LAB — TROPONIN I: Troponin I: 0.04 ng/mL (ref <0.03)

## 2016-04-19 LAB — BASIC METABOLIC PANEL
Anion gap: 6 (ref 5–15)
BUN: 81 mg/dL — ABNORMAL HIGH (ref 6–20)
CHLORIDE: 117 mmol/L — AB (ref 101–111)
CO2: 19 mmol/L — AB (ref 22–32)
CREATININE: 6.16 mg/dL — AB (ref 0.61–1.24)
Calcium: 8.2 mg/dL — ABNORMAL LOW (ref 8.9–10.3)
GFR calc non Af Amer: 9 mL/min — ABNORMAL LOW (ref >60)
GFR, EST AFRICAN AMERICAN: 11 mL/min — AB (ref >60)
Glucose, Bld: 132 mg/dL — ABNORMAL HIGH (ref 65–99)
POTASSIUM: 5.3 mmol/L — AB (ref 3.5–5.1)
Sodium: 142 mmol/L (ref 135–145)

## 2016-04-19 LAB — CBC
HEMATOCRIT: 37.2 % — AB (ref 39.0–52.0)
HEMATOCRIT: 36.9 % — AB (ref 39.0–52.0)
HEMOGLOBIN: 11.9 g/dL — AB (ref 13.0–17.0)
Hemoglobin: 11.7 g/dL — ABNORMAL LOW (ref 13.0–17.0)
MCH: 26.4 pg (ref 26.0–34.0)
MCH: 26.3 pg (ref 26.0–34.0)
MCHC: 32 g/dL (ref 30.0–36.0)
MCHC: 31.7 g/dL (ref 30.0–36.0)
MCV: 82.5 fL (ref 78.0–100.0)
MCV: 82.9 fL (ref 78.0–100.0)
PLATELETS: 163 10*3/uL (ref 150–400)
Platelets: 158 10*3/uL (ref 150–400)
RBC: 4.51 MIL/uL (ref 4.22–5.81)
RBC: 4.45 MIL/uL (ref 4.22–5.81)
RDW: 18.8 % — ABNORMAL HIGH (ref 11.5–15.5)
RDW: 19.1 % — AB (ref 11.5–15.5)
WBC: 8.6 10*3/uL (ref 4.0–10.5)
WBC: 8.2 10*3/uL (ref 4.0–10.5)

## 2016-04-19 LAB — IRON AND TIBC
Iron: 41 ug/dL — ABNORMAL LOW (ref 45–182)
Saturation Ratios: 18 % (ref 17.9–39.5)
TIBC: 232 ug/dL — ABNORMAL LOW (ref 250–450)
UIBC: 191 ug/dL

## 2016-04-19 LAB — PHOSPHORUS: Phosphorus: 4.6 mg/dL (ref 2.5–4.6)

## 2016-04-19 LAB — BRAIN NATRIURETIC PEPTIDE: B Natriuretic Peptide: 2132.3 pg/mL — ABNORMAL HIGH (ref 0.0–100.0)

## 2016-04-19 LAB — FERRITIN: FERRITIN: 154 ng/mL (ref 24–336)

## 2016-04-19 MED ORDER — HEPARIN SODIUM (PORCINE) 5000 UNIT/ML IJ SOLN
5000.0000 [IU] | Freq: Three times a day (TID) | INTRAMUSCULAR | Status: DC
  Administered 2016-04-19 – 2016-04-24 (×14): 5000 [IU] via SUBCUTANEOUS
  Filled 2016-04-19 (×14): qty 1

## 2016-04-19 MED ORDER — HYDRALAZINE HCL 20 MG/ML IJ SOLN
10.0000 mg | Freq: Once | INTRAMUSCULAR | Status: AC
  Administered 2016-04-19: 10 mg via INTRAVENOUS
  Filled 2016-04-19: qty 1

## 2016-04-19 MED ORDER — NEPRO/CARBSTEADY PO LIQD
237.0000 mL | Freq: Two times a day (BID) | ORAL | Status: DC
  Administered 2016-04-20 (×2): 237 mL via ORAL

## 2016-04-19 MED ORDER — SODIUM CHLORIDE 0.9% FLUSH
3.0000 mL | Freq: Two times a day (BID) | INTRAVENOUS | Status: DC
  Administered 2016-04-19 – 2016-04-23 (×8): 3 mL via INTRAVENOUS

## 2016-04-19 MED ORDER — ISOSORBIDE MONONITRATE ER 30 MG PO TB24
30.0000 mg | ORAL_TABLET | Freq: Every day | ORAL | Status: DC
  Administered 2016-04-19 – 2016-04-20 (×2): 30 mg via ORAL
  Filled 2016-04-19 (×2): qty 1

## 2016-04-19 MED ORDER — NITROGLYCERIN 2 % TD OINT
1.0000 [in_us] | TOPICAL_OINTMENT | Freq: Once | TRANSDERMAL | Status: AC
  Administered 2016-04-19: 1 [in_us] via TOPICAL
  Filled 2016-04-19: qty 1

## 2016-04-19 MED ORDER — SODIUM CHLORIDE 0.9 % IV SOLN: 250.0000 mL | INTRAVENOUS | Status: DC | PRN

## 2016-04-19 MED ORDER — ISOSORB DINITRATE-HYDRALAZINE 20-37.5 MG PO TABS: 1.0000 | ORAL_TABLET | Freq: Three times a day (TID) | ORAL | Status: DC

## 2016-04-19 MED ORDER — ACETAMINOPHEN 325 MG PO TABS: 650.0000 mg | ORAL_TABLET | ORAL | Status: DC | PRN

## 2016-04-19 MED ORDER — PRAVASTATIN SODIUM 40 MG PO TABS
40.0000 mg | ORAL_TABLET | Freq: Every day | ORAL | Status: DC
  Administered 2016-04-20 – 2016-04-24 (×5): 40 mg via ORAL
  Filled 2016-04-19 (×5): qty 1

## 2016-04-19 MED ORDER — CARVEDILOL 12.5 MG PO TABS
12.5000 mg | ORAL_TABLET | Freq: Two times a day (BID) | ORAL | Status: DC
  Administered 2016-04-19 – 2016-04-21 (×4): 12.5 mg via ORAL
  Filled 2016-04-19 (×4): qty 1

## 2016-04-19 MED ORDER — HYDRALAZINE HCL 20 MG/ML IJ SOLN: 10.0000 mg | Freq: Four times a day (QID) | INTRAMUSCULAR | Status: DC | PRN

## 2016-04-19 MED ORDER — FUROSEMIDE 10 MG/ML IJ SOLN
80.0000 mg | Freq: Two times a day (BID) | INTRAMUSCULAR | Status: DC
  Administered 2016-04-19 – 2016-04-20 (×2): 80 mg via INTRAVENOUS
  Filled 2016-04-19 (×2): qty 8

## 2016-04-19 MED ORDER — CLONIDINE HCL 0.1 MG PO TABS: 0.1000 mg | ORAL_TABLET | ORAL | Status: DC | PRN

## 2016-04-19 MED ORDER — FUROSEMIDE 10 MG/ML IJ SOLN
20.0000 mg | INTRAMUSCULAR | Status: AC
  Administered 2016-04-19: 20 mg via INTRAVENOUS
  Filled 2016-04-19: qty 4

## 2016-04-19 MED ORDER — ONDANSETRON HCL 4 MG/2ML IJ SOLN: 4.0000 mg | Freq: Four times a day (QID) | INTRAMUSCULAR | Status: DC | PRN

## 2016-04-19 MED ORDER — ASPIRIN EC 81 MG PO TBEC
81.0000 mg | DELAYED_RELEASE_TABLET | Freq: Every day | ORAL | Status: DC
  Administered 2016-04-19 – 2016-04-24 (×6): 81 mg via ORAL
  Filled 2016-04-19 (×6): qty 1

## 2016-04-19 MED ORDER — HYDRALAZINE HCL 50 MG PO TABS
50.0000 mg | ORAL_TABLET | Freq: Three times a day (TID) | ORAL | Status: DC
  Administered 2016-04-19 – 2016-04-21 (×6): 50 mg via ORAL
  Filled 2016-04-19 (×6): qty 1

## 2016-04-19 MED ORDER — SODIUM CHLORIDE 0.9% FLUSH: 3.0000 mL | INTRAVENOUS | Status: DC | PRN

## 2016-04-19 NOTE — ED Triage Notes (Signed)
Pt reports needs medication refill for his BP meds that  the has been out x 6 months . High BP 218/147 reading in triage. Pt co SOB . denies chest pain. denies headache nor blurry vision. Pt ambulated to triage without difficulty.

## 2016-04-19 NOTE — ED Notes (Signed)
Bed: WTR8 Expected date:  Expected time:  Means of arrival:  Comments: 

## 2016-04-19 NOTE — Progress Notes (Signed)
Entered in d/c instructions Please use the resources provided to you in emergency room by case manager to assist you're your choice of doctor for follow up  These [place] uninsured resources provide possible primary care providers, resources for discounted medications, housing, dental resources, affordable care act information, plus other resources for [facility]   A referral for you has been sent to [employer] for community care network if you have not received a call in 3 days you may contact them Call Sylvie Farrier at [email or web address]

## 2016-04-19 NOTE — Progress Notes (Signed)
CM spoke with pt who confirms uninsured [employer] resident with no pcp.  CM discussed and provided written information to assist pt with determining choice for uninsured accepting pcps, discussed the importance of pcp vs EDP services for f/u care, www.needymeds.org, www.goodrx.com, discounted pharmacies and other [employer] such as [employer] , [place], affordable care act, financial assistance, uninsured dental services,  med assist, DSS and  health department  Reviewed resources for [employer] uninsured accepting pcps like [facility] Bloss[facility], family medicine at [address], [facility] [place], [facility], local urgent care centers, [facility], [facility] family practice, general medical clinics, family services of the [place], [facility] urgent care plus others, medication resources, [facility] out patient pharmacies and housing Pt voiced understanding and appreciation of resources provided   Provided [facility] contact information Pt agreed to a referral Cm completed referral Pt to be contact by [facility] clinical liaison Pt confirms he moved "back" to [place] from [place] [employer] (were he states he does have pcp but has not been able to see one) Pt states he moved in area at the "end of July" but nursing note indicated he has not obtained medication refills in 6 months  CM discussed preference to find an uninsured pcp in [place] vs having EDP to refill medications  Pt encouraged interaction with [facility] and use of goodrx for medication resource to get discounted meds

## 2016-04-19 NOTE — H&P (Signed)
History and Physical  Edward Jordan UYZ:[id number] DOB: [birth year] DOA: 04/19/2016  Referring physician: EDP PCP: No primary care provider on file.  Patient is from home, recently relocated to [place], lives with a friend  Chief Complaint: sob/DOE/edema  HPI: Edward Jordan is a 55 y.o. male  With h/o htn, ckd, chf ( last ef 35-40 in 2013), noncompliant, presented to [facility] long ED due to above complaints, he reported he started notice DOE a few weeks, this gets progressively worse, he also noticed progressive lower extremity edema, he denies chest pain, no syncope, no fever, he is able to lay flat at night, he continue to make urine.  ED: bp elevated, labs with elevated bnp at 2132, troponin 0.05, cr 6, k 5.3, cxr with pulmonary edema and small pleural effusion, EKG with sinus tachycardia, nonspecific st/t changes, he received iv lasix 20mg  iv x1, hospitalist called to admit the patient.  Initially patient did not want to stay in the hospital, but finally agreed to stay overnight.   Review of Systems:  Detail per HPI, Review of systems are otherwise negative  Past Medical History:  Diagnosis Date  . Hypertension    Past Surgical History:  Procedure Laterality Date  . NO PAST SURGERIES     Social History:  reports that he has never smoked. He has never used smokeless tobacco. He reports that he drinks alcohol. He reports that he does not use drugs. Patient lives at home & is able to participate in activities of daily living independently   No Known Allergies  No family history on file.    Prior to Admission medications   Medication Sig Start Date End Date Taking? Authorizing Provider  hydrALAZINE (APRESOLINE) 50 MG tablet Take 50 mg by mouth 3 (three) times daily.   Yes Historical Provider, MD  metolazone (ZAROXOLYN) 2.5 MG tablet Take 2.5 mg by mouth every morning.   Yes Historical Provider, MD  metoprolol (LOPRESSOR) 100 MG tablet Take 100 mg by mouth 2 (two) times daily.   Yes  Historical Provider, MD  naproxen sodium (ANAPROX) 220 MG tablet Take 440 mg by mouth daily as needed (when in pain).   Yes Historical Provider, MD  cyclobenzaprine (FLEXERIL) 10 MG tablet Take 1 tablet (10 mg total) by mouth 2 (two) times daily as needed for muscle spasms. Patient not taking: Reported on 04/19/2016 10/29/12   Ezequiel Essex, MD  enalapril (VASOTEC) 20 MG tablet Take 1 tablet (20 mg total) by mouth daily. Patient not taking: Reported on 04/19/2016 07/02/11 10/29/12  Venetia Maxon Rama, MD  ibuprofen (ADVIL,MOTRIN) 800 MG tablet Take 1 tablet (800 mg total) by mouth 3 (three) times daily. Patient not taking: Reported on 04/19/2016 10/29/12   Ezequiel Essex, MD  metoprolol (LOPRESSOR) 100 MG tablet Take 1 tablet (100 mg total) by mouth 2 (two) times daily. 07/02/11 10/29/12  Venetia Maxon Rama, MD  oxyCODONE-acetaminophen (PERCOCET/ROXICET) 5-325 MG per tablet Take 2 tablets by mouth every 4 (four) hours as needed for pain. Patient not taking: Reported on 04/19/2016 10/29/12   Ezequiel Essex, MD  pravastatin (PRAVACHOL) 40 MG tablet Take 1 tablet (40 mg total) by mouth daily. Patient not taking: Reported on 04/19/2016 07/02/11 10/29/12  Venetia Maxon Rama, MD    Physical Exam: BP (!) 181/116   Pulse 93   Temp 98.2 F (36.8 C)   Resp 18   SpO2 100%   General:  Obese, NAD Eyes: PERRL ENT: unremarkable Neck: supple, no JVD Cardiovascular: RRR Respiratory: mild crackles at  basis Abdomen: soft/ND/ND, positive bowel sounds Skin: no rash Musculoskeletal:  Pitting  Edema bilateral lower extremity Psychiatric: calm/cooperative Neurologic: no focal findings            Labs on Admission:  Basic Metabolic Panel:  Recent Labs Lab 04/19/16 1241  NA 142  K 5.3*  CL 117*  CO2 19*  GLUCOSE 132*  BUN 81*  CREATININE 6.16*  CALCIUM 8.2*   Liver Function Tests: No results for input(s): AST, ALT, ALKPHOS, BILITOT, PROT, ALBUMIN in the last 168 hours. No results for input(s): LIPASE,  AMYLASE in the last 168 hours. No results for input(s): AMMONIA in the last 168 hours. CBC:  Recent Labs Lab 04/19/16 1241  WBC 8.6  HGB 11.9*  HCT 37.2*  MCV 82.5  PLT 163   Cardiac Enzymes: No results for input(s): CKTOTAL, CKMB, CKMBINDEX, TROPONINI in the last 168 hours.  BNP (last 3 results)  Recent Labs  04/19/16 1241  BNP 2,132.3*    ProBNP (last 3 results) No results for input(s): PROBNP in the last 8760 hours.  CBG: No results for input(s): GLUCAP in the last 168 hours.  Radiological Exams on Admission: Dg Chest 2 View  Result Date: 04/19/2016 CLINICAL DATA:  Shortness of breath. EXAM: CHEST  2 VIEW COMPARISON:  12/19/2012. FINDINGS: Cardiomegaly with pulmonary vascular prominence and bilateral interstitial prominence with small right pleural effusion. Findings consistent with congestive heart failure . No pneumothorax. No acute bony abnormality. IMPRESSION: Congestive heart failure with pulmonary interstitial edema and small right pleural effusion. Electronically Signed   By: Marcello Moores  Register   On: 04/19/2016 12:25     Assessment/Plan Present on Admission: . Acute renal failure (ARF) (HCC)  Combined chf exacerbation with pulmonary edema, bilateral lower extremity edema:  Presented with DOE, edema, currently on room air at rest, denies chest pain Continue cycle troponin, echocardiogram, cardiology consulted. Fluids restriction, daily weight, strict I/o's, iv lasix, coreg, not a candidate for acei/arb due to cr elevation. Venous doppler lower extremity to r/o DVT.  Renal failure, likely acute on chronic, ua pending, renal US pending, nephrology input appreciated.  Mild hyperkalemia, k 5.3 on admission, repeat in am    HTN; he does not take any meds at home Start coreg, hydralazine, imdur, on iv lasix, titrate  Morbid obesity Body mass index is 50.29 kg/m. Will need outpatient sleep study to r/o osa  Medication noncompliance, need ongoing  education  DVT prophylaxis: heparin subQ  Consultants:  Cardiology ( talked to cards master) Nephrology ( talked to Dr Mercy Moore)  Code Status: full   Family Communication:  Patient   Disposition Plan: admit to [place] med tele  Time spent: 27mins  Jerald Villalona MD, PhD [facility] Pager [phone] If 7PM-7AM, please contact night-coverage at [email or web address], password [facility]

## 2016-04-19 NOTE — ED Notes (Signed)
Unable to collect labs patient is in xray 

## 2016-04-19 NOTE — ED Provider Notes (Signed)
[facility] DEPT Provider Note   CSN: [id number] Arrival date & time: 04/19/16  1136     History   Chief Complaint Chief Complaint  Patient presents with  . Hypertension    HPI Edward Jordan is a 55 y.o. male.  55yo M w/ PMH including HTN, CKD, HLD who p/w hypertension. The patient has been out of his blood pressure medications for 6 months and presents here for refill. On review of systems, the patient endorses shortness of breath with exertion over the past few weeks. He denies any associated chest pain. No orthopnea or paroxysmal nocturnal dyspnea. he has noticed some lower extremity edema. Normal urination. No cough/cold symptoms.   The history is provided by the patient.  Hypertension     Past Medical History:  Diagnosis Date  . Hypertension     Patient Active Problem List   Diagnosis Date Noted  . Acute renal failure (ARF) ([place]) 04/19/2016  . Systolic CHF, chronic ([place]) 07/02/2011  . Accelerated hypertension 07/01/2011  . CKD (chronic kidney disease) stage 3, GFR 30-59 ml/min 07/01/2011  . Morbid obesity ([place]) 07/01/2011  . Hyperglycemia 07/01/2011  . Elevated CK 07/01/2011  . Hyperlipidemia 07/01/2011    Past Surgical History:  Procedure Laterality Date  . NO PAST SURGERIES         Home Medications    Prior to Admission medications   Medication Sig Start Date End Date Taking? Authorizing Provider  hydrALAZINE (APRESOLINE) 50 MG tablet Take 50 mg by mouth 3 (three) times daily.   Yes Historical Provider, MD  metolazone (ZAROXOLYN) 2.5 MG tablet Take 2.5 mg by mouth every morning.   Yes Historical Provider, MD  metoprolol (LOPRESSOR) 100 MG tablet Take 100 mg by mouth 2 (two) times daily.   Yes Historical Provider, MD  naproxen sodium (ANAPROX) 220 MG tablet Take 440 mg by mouth daily as needed (when in pain).   Yes Historical Provider, MD  cyclobenzaprine (FLEXERIL) 10 MG tablet Take 1 tablet (10 mg total) by mouth 2 (two) times daily as needed for  muscle spasms. Patient not taking: Reported on 04/19/2016 10/29/12   Ezequiel Essex, MD  enalapril (VASOTEC) 20 MG tablet Take 1 tablet (20 mg total) by mouth daily. Patient not taking: Reported on 04/19/2016 07/02/11 10/29/12  Venetia Maxon Rama, MD  ibuprofen (ADVIL,MOTRIN) 800 MG tablet Take 1 tablet (800 mg total) by mouth 3 (three) times daily. Patient not taking: Reported on 04/19/2016 10/29/12   Ezequiel Essex, MD  metoprolol (LOPRESSOR) 100 MG tablet Take 1 tablet (100 mg total) by mouth 2 (two) times daily. 07/02/11 10/29/12  Venetia Maxon Rama, MD  oxyCODONE-acetaminophen (PERCOCET/ROXICET) 5-325 MG per tablet Take 2 tablets by mouth every 4 (four) hours as needed for pain. Patient not taking: Reported on 04/19/2016 10/29/12   Ezequiel Essex, MD  pravastatin (PRAVACHOL) 40 MG tablet Take 1 tablet (40 mg total) by mouth daily. Patient not taking: Reported on 04/19/2016 07/02/11 10/29/12  Venetia Maxon Rama, MD    Family History No family history on file.  Social History Social History  Substance Use Topics  . Smoking status: Never Smoker  . Smokeless tobacco: Never Used  . Alcohol use Yes     Comment: ocassionally     Allergies   Patient has no known allergies.   Review of Systems Review of Systems 10 Systems reviewed and are negative for acute change except as noted in the HPI.   Physical Exam Updated Vital Signs BP (!) 182/130   Pulse  92   Temp 98.2 F (36.8 C)   Resp 19   SpO2 100%   Physical Exam  Constitutional: He is oriented to person, place, and time. He appears well-developed and well-nourished. No distress.  HENT:  Head: Normocephalic and atraumatic.  Moist mucous membranes  Eyes: Conjunctivae are normal. Pupils are equal, round, and reactive to light.  Neck: Neck supple.  Cardiovascular: Normal rate, regular rhythm and normal heart sounds.   No murmur heard. Pulmonary/Chest: Effort normal.  Diminished BS b/l, mildly increased WOB  Abdominal: Soft. Bowel  sounds are normal. He exhibits no distension. There is no tenderness.  Musculoskeletal: He exhibits edema (3+ BLE to knees).  Neurological: He is alert and oriented to person, place, and time.  Fluent speech  Skin: Skin is warm and dry.  Psychiatric: He has a normal mood and affect. Judgment normal.  Nursing note and vitals reviewed.    ED Treatments / Results  Labs (all labs ordered are listed, but only abnormal results are displayed) Labs Reviewed  BASIC METABOLIC PANEL - Abnormal; Notable for the following:       Result Value   Potassium 5.3 (*)    Chloride 117 (*)    CO2 19 (*)    Glucose, Bld 132 (*)    BUN 81 (*)    Creatinine, Ser 6.16 (*)    Calcium 8.2 (*)    GFR calc non Af Amer 9 (*)    GFR calc Af Amer 11 (*)    All other components within normal limits  CBC - Abnormal; Notable for the following:    Hemoglobin 11.9 (*)    HCT 37.2 (*)    RDW 18.8 (*)    All other components within normal limits  BRAIN NATRIURETIC PEPTIDE - Abnormal; Notable for the following:    B Natriuretic Peptide 2,132.3 (*)    All other components within normal limits  I-STAT TROPOININ, ED    EKG  EKG Interpretation  Date/Time:  Monday April 19 2016 12:03:35 EST Ventricular Rate:  102 PR Interval:    QRS Duration: 102 QT Interval:  352 QTC Calculation: 459 R Axis:   -6 Text Interpretation:  Sinus tachycardia Probable left atrial enlargement Low voltage, precordial leads Nonspecific T abnormalities, lateral leads No previous ECGs available Confirmed by Ona Rathert MD, Ensley Blas [phone]) on 04/19/2016 12:46:34 PM       Radiology Dg Chest 2 View  Result Date: 04/19/2016 CLINICAL DATA:  Shortness of breath. EXAM: CHEST  2 VIEW COMPARISON:  12/19/2012. FINDINGS: Cardiomegaly with pulmonary vascular prominence and bilateral interstitial prominence with small right pleural effusion. Findings consistent with congestive heart failure . No pneumothorax. No acute bony abnormality. IMPRESSION:  Congestive heart failure with pulmonary interstitial edema and small right pleural effusion. Electronically Signed   By: Marcello Moores  Register   On: 04/19/2016 12:25    Procedures .Critical Care Performed by: Sharlett Iles Authorized by: Sharlett Iles   Critical care provider statement:    Critical care time (minutes):  30   Critical care was necessary to treat or prevent imminent or life-threatening deterioration of the following conditions:  Renal failure   Critical care was time spent personally by me on the following activities:  Development of treatment plan with patient or surrogate, evaluation of patient's response to treatment, ordering and performing treatments and interventions, re-evaluation of patient's condition, review of old charts, ordering and review of laboratory studies and ordering and review of radiographic studies   (including critical care  time)  Medications Ordered in ED Medications  nitroGLYCERIN (NITROGLYN) 2 % ointment 1 inch (1 inch Topical Given 04/19/16 1417)  furosemide (LASIX) injection 20 mg (20 mg Intravenous Given 04/19/16 1418)     Initial Impression / Assessment and Plan / ED Course  I have reviewed the triage vital signs and the nursing notes.  Pertinent labs & imaging results that were available during my care of the patient were reviewed by me and considered in my medical decision making (see chart for details).  Clinical Course    Pt w/ known HTN and CKD p/w request for refill on HTN medications. He was dyspneic at triage and thus obtained labs and CXR. EKG without acute ischemic changes. He had mildly increased work of breathing for me with LE edema, no respiratory distress. He was hypertensive at 191/129. Gave the patient Nitropaste. Labs notable for creatinine 6.16, BUN 81, potassium 5.3, hemoglobin 11.9, troponin, BNP at 2132. Chest x-ray with CHF and interstitial edema with right pleural effusion. Gave the patient 20 mg IV Lasix. I  spent a long time discussing these concerning results with the patient and improve him to stay in the hospital although he was reluctant to stay. He finally agreed to admission. Discussed with Dr. Maryland Pink, who has recommended transfer to [facility] for nephrology consultation. Pt will be transferred for further treatment.  Final Clinical Impressions(s) / ED Diagnoses   Final diagnoses:  Hypertensive emergency  Congestive heart failure, unspecified congestive heart failure chronicity, unspecified congestive heart failure type (HCC)  Acute renal failure, unspecified acute renal failure type ([place])  Hyperkalemia    New Prescriptions New Prescriptions   No medications on file     Sharlett Iles, MD 04/19/16 1540

## 2016-04-19 NOTE — ED Notes (Signed)
Report called by this RN to Wells Guiles, RN at [facility], not [place], Therapist, sports.

## 2016-04-19 NOTE — Consult Note (Signed)
Edward Jordan is an 55 y.o. male referred by Dr Marily Memos  Chief Complaint: CKD, HTN, volume overload HPI: 55yo BM with hx HTN and CKD presents to ER for SOB.  Pt had moved to [place] a few years ago for work but now has moved back to [place] for work.  Says he just found out today he had CKD but he has had CKD since at least 2013 or longer based on labs.  Scr then was 2.29-2.61, 10/2012 Scr was 3.0 and now 6.16.  He has noticed increasing edema over last 2 weeks.  He has not taken any BP meds for at least 6 months.  Took 2 advil few days ago but that is all.  No change in UO.    Past Medical History:  Diagnosis Date  . Hypertension     Past Surgical History:  Procedure Laterality Date  . NO PAST SURGERIES      No family history on file.  FH neg for HTN or CKD Social History:  reports that he has never smoked. He has never used smokeless tobacco. He reports that he drinks alcohol. He reports that he does not use drugs.  Allergies: No Known Allergies  Medications Prior to Admission  Medication Sig Dispense Refill  . hydrALAZINE (APRESOLINE) 50 MG tablet Take 50 mg by mouth 3 (three) times daily.    . metolazone (ZAROXOLYN) 2.5 MG tablet Take 2.5 mg by mouth every morning.    . metoprolol (LOPRESSOR) 100 MG tablet Take 100 mg by mouth 2 (two) times daily.    . naproxen sodium (ANAPROX) 220 MG tablet Take 440 mg by mouth daily as needed (when in pain).    . cyclobenzaprine (FLEXERIL) 10 MG tablet Take 1 tablet (10 mg total) by mouth 2 (two) times daily as needed for muscle spasms. (Patient not taking: Reported on 04/19/2016) 20 tablet 0  . enalapril (VASOTEC) 20 MG tablet Take 1 tablet (20 mg total) by mouth daily. (Patient not taking: Reported on 04/19/2016) 30 tablet 3  . ibuprofen (ADVIL,MOTRIN) 800 MG tablet Take 1 tablet (800 mg total) by mouth 3 (three) times daily. (Patient not taking: Reported on 04/19/2016) 21 tablet 0  . metoprolol (LOPRESSOR) 100 MG tablet Take 1 tablet (100 mg total) by  mouth 2 (two) times daily. 60 tablet 3  . oxyCODONE-acetaminophen (PERCOCET/ROXICET) 5-325 MG per tablet Take 2 tablets by mouth every 4 (four) hours as needed for pain. (Patient not taking: Reported on 04/19/2016) 15 tablet 0  . pravastatin (PRAVACHOL) 40 MG tablet Take 1 tablet (40 mg total) by mouth daily. (Patient not taking: Reported on 04/19/2016) 30 tablet 3     Lab Results: UA: ND  Recent Labs  04/19/16 1241  WBC 8.6  HGB 11.9*  HCT 37.2*  PLT 163   BMET  Recent Labs  04/19/16 1241  NA 142  K 5.3*  CL 117*  CO2 19*  GLUCOSE 132*  BUN 81*  CREATININE 6.16*  CALCIUM 8.2*   LFT No results for input(s): PROT, ALBUMIN, AST, ALT, ALKPHOS, BILITOT, BILIDIR, IBILI in the last 72 hours. Dg Chest 2 View  Result Date: 04/19/2016 CLINICAL DATA:  Shortness of breath. EXAM: CHEST  2 VIEW COMPARISON:  12/19/2012. FINDINGS: Cardiomegaly with pulmonary vascular prominence and bilateral interstitial prominence with small right pleural effusion. Findings consistent with congestive heart failure . No pneumothorax. No acute bony abnormality. IMPRESSION: Congestive heart failure with pulmonary interstitial edema and small right pleural effusion. Electronically Signed   By:  [place]   On: 04/19/2016 12:25    ROS: No change in vision + DOE No CP No change in bowels No neuropathic Sx No dysuria No arthritic CO  PHYSICAL EXAM: Blood pressure (!) 203/125, pulse (!) 101, temperature 97.5 F (36.4 C), temperature source Oral, resp. rate 20, height 6' (1.829 m), weight (!) 168.2 kg (370 lb 13 oz), SpO2 100 %. HEENT: PERRLA EOMI NECK:+ JVD LUNGS:Decreased BS throughout CARDIAC:RRR wo MRG ABD:+ BS NTND No overt HSM EXT:2+ edema Pulses: 2/4=bilat NEURO:CNI M&SI, OX3, no asterixis  Assessment: 1. Suspect CKD 5 vs acute on CKD 4 secondary to poorly controlled HTN 2. Mild hyperkalemia 3. Mild Met acidosis 4. Pulm edema 5. Cardiomyopathy based on echo in 2013 showing EF  35-40% 6. Mild anemia  Hg 11.9 PLAN: 1. Diurese with IV lasix 2. Resume BP meds.  I would not use bidil as it limits dosing of hydralazine and is more expensive so would recommend hydralazine 62m tid.  You can add isosorbide separately if needed 3. Check PTH 4. Show pt dialysis videos to get some education of his future 5. Daily labs 6. Check PO4 7. Renal diet 8. Check renal [place]9. SPEP for completeness sake but suspect HTN as cause for CKD 10. Dietitian to see 11. Clonidine prn for BP control   Shade Kaley T 04/19/2016, 6:20 PM

## 2016-04-19 NOTE — ED Notes (Signed)
Pt demonstrated obvious exertional sob when ambulating from triage area to current room. Pt paused at RN station and began to demonstrate labored breathing while catching breath. Pt states he has been feeling sob while ambulating for the past week.

## 2016-04-19 NOTE — ED Notes (Signed)
Report called to [facility] and to [facility], floor 6E, to [place], [place]

## 2016-04-20 ENCOUNTER — Inpatient Hospital Stay (HOSPITAL_COMMUNITY): Payer: Self-pay

## 2016-04-20 DIAGNOSIS — E872 Acidosis: Secondary | ICD-10-CM

## 2016-04-20 DIAGNOSIS — N184 Chronic kidney disease, stage 4 (severe): Secondary | ICD-10-CM

## 2016-04-20 DIAGNOSIS — E785 Hyperlipidemia, unspecified: Secondary | ICD-10-CM

## 2016-04-20 DIAGNOSIS — M79606 Pain in leg, unspecified: Secondary | ICD-10-CM

## 2016-04-20 DIAGNOSIS — I10 Essential (primary) hypertension: Secondary | ICD-10-CM

## 2016-04-20 DIAGNOSIS — R748 Abnormal levels of other serum enzymes: Secondary | ICD-10-CM

## 2016-04-20 DIAGNOSIS — I5023 Acute on chronic systolic (congestive) heart failure: Secondary | ICD-10-CM

## 2016-04-20 DIAGNOSIS — N179 Acute kidney failure, unspecified: Secondary | ICD-10-CM

## 2016-04-20 LAB — URINALYSIS, ROUTINE W REFLEX MICROSCOPIC
Bilirubin Urine: NEGATIVE
Glucose, UA: NEGATIVE mg/dL
Hgb urine dipstick: NEGATIVE
Ketones, ur: NEGATIVE mg/dL
LEUKOCYTES UA: NEGATIVE
NITRITE: NEGATIVE
PH: 5 (ref 5.0–8.0)
Protein, ur: 30 mg/dL — AB
SPECIFIC GRAVITY, URINE: 1.007 (ref 1.005–1.030)

## 2016-04-20 LAB — PROTEIN / CREATININE RATIO, URINE
CREATININE, URINE: 28.69 mg/dL
PROTEIN CREATININE RATIO: 1.32 mg/mg{creat} — AB (ref 0.00–0.15)
TOTAL PROTEIN, URINE: 38 mg/dL

## 2016-04-20 LAB — RAPID URINE DRUG SCREEN, HOSP PERFORMED
AMPHETAMINES: NOT DETECTED
Barbiturates: NOT DETECTED
Benzodiazepines: NOT DETECTED
Cocaine: NOT DETECTED
OPIATES: NOT DETECTED
Tetrahydrocannabinol: NOT DETECTED

## 2016-04-20 LAB — LIPID PANEL
CHOLESTEROL: 118 mg/dL (ref 0–200)
HDL: 29 mg/dL — AB (ref >40)
LDL CALC: 79 mg/dL (ref 0–99)
TRIGLYCERIDES: 50 mg/dL (ref <150)
Total CHOL/HDL Ratio: 4.1 RATIO
VLDL: 10 mg/dL (ref 0–40)

## 2016-04-20 LAB — TROPONIN I
TROPONIN I: 0.04 ng/mL — AB (ref <0.03)
TROPONIN I: 0.03 ng/mL — AB (ref <0.03)

## 2016-04-20 LAB — URINE MICROSCOPIC-ADD ON

## 2016-04-20 MED ORDER — SODIUM CHLORIDE 0.9 % IV SOLN
510.0000 mg | Freq: Once | INTRAVENOUS | Status: AC
  Administered 2016-04-20: 510 mg via INTRAVENOUS
  Filled 2016-04-20 (×2): qty 17

## 2016-04-20 MED ORDER — FUROSEMIDE 10 MG/ML IJ SOLN
80.0000 mg | Freq: Three times a day (TID) | INTRAMUSCULAR | Status: DC
  Administered 2016-04-20 – 2016-04-24 (×12): 80 mg via INTRAVENOUS
  Filled 2016-04-20 (×12): qty 8

## 2016-04-20 MED ORDER — ISOSORBIDE MONONITRATE ER 60 MG PO TB24
60.0000 mg | ORAL_TABLET | Freq: Every day | ORAL | Status: DC
  Administered 2016-04-21 – 2016-04-24 (×4): 60 mg via ORAL
  Filled 2016-04-20 (×4): qty 1

## 2016-04-20 NOTE — Progress Notes (Addendum)
Nutrition Brief Note  RD consulted for diet education regarding chronic kidney disease stage 5. Per MD note, pt not in frame of mind to proceed with vein mapping or access. During time of visit, pt was conversing with a visitor and would like RD to return later in the day for diet education. Diet handout "Eating Healthy with Kidney Disease" was put at pt bedside. Pt is currently on a renal 1200 ml fluid diet with 100% meal completion. Pt reports having a good appetite currently and PTA with no other difficulties. Labs and medications reviewed. Upon RD return in the afternoon, pt was unavailable. RD to return at a later time to educate pt on the renal diet.   Corrin Parker, MS, RD, LDN Pager # [phone] After hours/ weekend pager # [phone]

## 2016-04-20 NOTE — Progress Notes (Signed)
[facility] Progress Note   Subjective:   Patient denies SOB currently. States that swelling his legs has improved significantly, he also indicates improvement in the abdominal tightness he felt on admission.   Objective:   BP 134/89   Pulse 75   Temp 97.7 F (36.5 C) (Oral)   Resp 20   Ht 6' (1.829 m)   Wt (!) 370 lb 13 oz (168.2 kg)   SpO2 100%   BMI 50.29 kg/m   Intake/Output Summary (Last 24 hours) at 04/20/16 1250 Last data filed at 04/20/16 1233  Gross per 24 hour  Intake             1020 ml  Output             2475 ml  Net            -1455 ml   Weight change:   Physical Exam: TMY:TRZNBVA up the chair, NAD  CVS:RRR, no murmurs noted  Resp: Slight crackles on the right side, no increased WOB, on room air Abd: BS+, no ttp  Ext: 3+ pitting edema to the knee, + thigh edema  Imaging: Dg Chest 2 View  Result Date: 04/19/2016 CLINICAL DATA:  Shortness of breath. EXAM: CHEST  2 VIEW COMPARISON:  12/19/2012. FINDINGS: Cardiomegaly with pulmonary vascular prominence and bilateral interstitial prominence with small right pleural effusion. Findings consistent with congestive heart failure . No pneumothorax. No acute bony abnormality. IMPRESSION: Congestive heart failure with pulmonary interstitial edema and small right pleural effusion. Electronically Signed   By: Marcello Moores  Register   On: 04/19/2016 12:25   US Renal  Result Date: 04/20/2016 CLINICAL DATA:  Chronic renal failure EXAM: RENAL / URINARY TRACT ULTRASOUND COMPLETE COMPARISON:  CT abdomen and pelvis Oct 29, 2012 FINDINGS: Right Kidney: Length: 11.5 cm. Echogenicity is increased. The renal cortical thickness is within normal limits. No perinephric fluid or hydronephrosis visualized. There is a a cyst arising from the upper pole the right kidney measuring 1.4 x 0.8 x 1.7 cm. A cyst more inferiorly measures 2.4 x 0.8 x 1.4 cm. No sonographically demonstrable calculus or ureterectasis. Left Kidney: Length: 11.9  cm. Echogenicity is increased. Renal cortical thickness is normal. There is a cyst in the mid left kidney measuring 0.9 x 1.1 x 1.2 cm. No perinephric fluid or hydronephrosis visualized. No sonographically demonstrable calculus or ureterectasis. Bladder: Appears normal for degree of bladder distention. There is a right pleural effusion. IMPRESSION: Kidneys are echogenic consistent with medical renal disease. No obstructing foci identified in either kidney. There are small renal cysts bilaterally. There is a right pleural effusion. Electronically Signed   By: Lowella Grip III M.D.   On: 04/20/2016 09:26   Lab Results  Component Value Date   CREATININE 6.24 (H) 04/19/2016   CREATININE 6.16 (H) 04/19/2016   CREATININE 3.00 (H) 10/29/2012    Recent Labs Lab 04/19/16 1241 04/19/16 1835 04/19/16 2012  NA 142  --   --   K 5.3*  --   --   CL 117*  --   --   CO2 19*  --   --   GLUCOSE 132*  --   --   BUN 81*  --   --   CREATININE 6.16* 6.24*  --   CALCIUM 8.2*  --   --   PHOS  --   --  4.6     Recent Labs Lab 04/19/16 1241 04/19/16 1835  WBC 8.6 8.2  HGB 11.9* 11.7*  HCT 37.2* 36.9*  MCV 82.5 82.9  PLT 163 158    Medications:    . aspirin EC  81 mg Oral Daily  . carvedilol  12.5 mg Oral BID WC  . feeding supplement (NEPRO CARB STEADY)  237 mL Oral BID BM  . furosemide  80 mg Intravenous Q8H  . heparin  5,000 Units Subcutaneous Q8H  . hydrALAZINE  50 mg Oral Q8H  . [START ON 04/21/2016] isosorbide mononitrate  60 mg Oral Daily  . pravastatin  40 mg Oral Daily  . sodium chloride flush  3 mL Intravenous Q12H    Background: Edward Jordan is an 55 y.o. male with pmhx HTN, CKD, and HFrEF ( EF 35-40%) presenting for SOB and hypertensive emergency likely with CHF exacerbation. In 2013/2014 was noted to have CKD4; now with worsening kidney function given non-compliance with antihypertensive medications over the past 6 months (unable to get uinsurance).   Assessment/ Plan:    1.  CKD 5 w proteinuria likely due to hx of uncontrolled HTN, not currently followed by nephrology/ per patient did not realize he had CKD. Renal ultrasound with small cysts bilaterally, kidney size 11.5 cm/ 11.9 cm right and left respectively. Normal sized kidney's not concerning extracellular tissue deposition associated disease.SPEP currently pending.  - SCr 6.24 - Unsure of previous baseline, though per records patient with CKD 4 in 2014.  - Volume status: Remains fluid overloaded on exam, currently on lasix 80 mg IV TID, UOP 1595 over the past 24 hours  - Electrolytes: Slightly hyperkalemic @ 5.3 yesterday, diuresis likely helped with this issue, BMET pending  - Establish care with nephrologist, dialysis education video provided previously (Dr. Mercy Moore plans to follow in the outpt setting)  2. Anemia: Hgb 11.7. Iron 41 Saturation Ratio 18. Provide Ferraheme x 1  3. Metabolic Bone Disease: PTH pending. Phosphorus 4.6. Follow up to determine if supplementation is needed   4. HTN - "home medications metolazone 2.5 mg, hydralazine 50 mg daily, enalapril 20 mg, lopressor 100 mg". Has not taken these medications in 6 months.   -  Current blood pressure medications include IMDUR 60 mg, hydralazine 50 mg TID, 12.5 mg Coreg BID, Lasix 80 IV TID   5. CHF: ECHO (EF35-40%, 2013) BNP 2,132. Receiving diuresis. Echo pending. Troponin slightly elevated. Cardiology consulted     Kerrin Mo, MD 04/20/2016, 12:50 PM   I have seen and examined this patient and agree with plan and assessment in the above note with renal recommendations/intervention highlighted. Very nice MO AAM who had progressive CKD over the course of 2013-2014 and presented with creatinine of 6 - I think most likely progression of his underlying disease. Agree with current BP and diuretic management. Has watched dialysis videos - he is not in the frame of mind to proceed with vein mapping or access planning as I think is still hopeful that  if BP controlled will improve some (I doubt).   Cameshia Cressman B,MD 04/20/2016 3:01 PM

## 2016-04-20 NOTE — Consult Note (Signed)
Reason for Consult:   CHF  Requesting Physician: Dr Dyann Kief Primary Cardiologist New, Edward Jordan  HPI:   55 y/o morbidly obese AA male, with a history of HTN, and CM, just moved here from [place] to look for work. He is staying with a friend. He has a history of HTN and prior cardiomyopathy (EF 35% in 2013). He had no PCP or cardiologist in [place] "I would just go to the ER". He has been out of his medications for several months and come to the ED here "to get refills". He has had increasing dyspnea and edema. His B/P on admission was 218/147. He was in in CHF- BNP 2132 as well as acute renal insufficiency with a SCr of 6. He denies chest pain or prior cath. Echo has been done-results pending.   PMHx:  Past Medical History:  Diagnosis Date  . Hypertension     Past Surgical History:  Procedure Laterality Date  . NO PAST SURGERIES      SOCHx:  reports that he has never smoked. He has never used smokeless tobacco. He reports that he drinks alcohol. He reports that he does not use drugs.  FAMHx: Denies any family history of early CAD  ALLERGIES: No Known Allergies  ROS: Review of Systems: General: negative for chills, fever, night sweats or weight changes.  Cardiovascular: negative for chest pain, dyspnea on exertion, edema, orthopnea, palpitations, paroxysmal nocturnal dyspnea or shortness of breath HEENT: negative for any visual disturbances, blindness, glaucoma Dermatological: negative for rash Respiratory: negative for cough, hemoptysis, or wheezing Urologic: negative for hematuria or dysuria Abdominal: negative for nausea, vomiting, diarrhea, bright red blood per rectum, melena, or hematemesis Neurologic: negative for visual changes, syncope, or dizziness Musculoskeletal: negative for back pain, joint pain, or swelling Psych: cooperative and appropriate All other systems reviewed and are otherwise negative except as noted above.   HOME  MEDICATIONS: Prior to Admission medications   Medication Sig Start Date End Date Taking? Authorizing Provider  hydrALAZINE (APRESOLINE) 50 MG tablet Take 50 mg by mouth 3 (three) times daily.   Yes Historical Provider, MD  metolazone (ZAROXOLYN) 2.5 MG tablet Take 2.5 mg by mouth every morning.   Yes Historical Provider, MD  metoprolol (LOPRESSOR) 100 MG tablet Take 100 mg by mouth 2 (two) times daily.   Yes Historical Provider, MD  naproxen sodium (ANAPROX) 220 MG tablet Take 440 mg by mouth daily as needed (when in pain).   Yes Historical Provider, MD  cyclobenzaprine (FLEXERIL) 10 MG tablet Take 1 tablet (10 mg total) by mouth 2 (two) times daily as needed for muscle spasms. Patient not taking: Reported on 04/19/2016 10/29/12   Ezequiel Essex, MD  enalapril (VASOTEC) 20 MG tablet Take 1 tablet (20 mg total) by mouth daily. Patient not taking: Reported on 04/19/2016 07/02/11 10/29/12  Venetia Maxon Rama, MD  ibuprofen (ADVIL,MOTRIN) 800 MG tablet Take 1 tablet (800 mg total) by mouth 3 (three) times daily. Patient not taking: Reported on 04/19/2016 10/29/12   Ezequiel Essex, MD  metoprolol (LOPRESSOR) 100 MG tablet Take 1 tablet (100 mg total) by mouth 2 (two) times daily. 07/02/11 10/29/12  Venetia Maxon Rama, MD  oxyCODONE-acetaminophen (PERCOCET/ROXICET) 5-325 MG per tablet Take 2 tablets by mouth every 4 (four) hours as needed for pain. Patient not taking: Reported on 04/19/2016 10/29/12   Ezequiel Essex, MD  pravastatin (PRAVACHOL) 40 MG tablet Take 1 tablet (40 mg total) by mouth daily. Patient not taking:  Reported on 04/19/2016 07/02/11 10/29/12  Venetia Maxon Rama, MD    HOSPITAL MEDICATIONS: I have reviewed the patient's current medications.  VITALS: Blood pressure (!) 163/100, pulse 76, temperature 97.7 F (36.5 C), temperature source Oral, resp. rate 20, height 6' (1.829 m), weight (!) 370 lb 13 oz (168.2 kg), SpO2 100 %.  PHYSICAL EXAM: General appearance: alert, cooperative, no distress and  morbidly obese Neck: no JVD Lungs: fine crackles at bases Heart: regular rate and rhythm Abdomen: morbid obesity Extremities: 2+ -3+ pre tibial pitting edema and chronic skin changes Pulses: 2+ and symmetric Skin: Skin color, texture, turgor normal. No rashes or lesions Neurologic: Grossly normal  LABS: Results for orders placed or performed during the hospital encounter of 04/19/16 (from the past 24 hour(s))  CBC     Status: Abnormal   Collection Time: 04/19/16  6:35 PM  Result Value Ref Range   WBC 8.2 4.0 - 10.5 K/uL   RBC 4.45 4.22 - 5.81 MIL/uL   Hemoglobin 11.7 (L) 13.0 - 17.0 g/dL   HCT 36.9 (L) 39.0 - 52.0 %   MCV 82.9 78.0 - 100.0 fL   MCH 26.3 26.0 - 34.0 pg   MCHC 31.7 30.0 - 36.0 g/dL   RDW 19.1 (H) 11.5 - 15.5 %   Platelets 158 150 - 400 K/uL  Creatinine, serum     Status: Abnormal   Collection Time: 04/19/16  6:35 PM  Result Value Ref Range   Creatinine, Ser 6.24 (H) 0.61 - 1.24 mg/dL   GFR calc non Af Amer 9 (L) >60 mL/min   GFR calc Af Amer 11 (L) >60 mL/min  Phosphorus     Status: None   Collection Time: 04/19/16  8:12 PM  Result Value Ref Range   Phosphorus 4.6 2.5 - 4.6 mg/dL  Iron and TIBC     Status: Abnormal   Collection Time: 04/19/16  8:12 PM  Result Value Ref Range   Iron 41 (L) 45 - 182 ug/dL   TIBC 232 (L) 250 - 450 ug/dL   Saturation Ratios 18 17.9 - 39.5 %   UIBC 191 ug/dL  Ferritin     Status: None   Collection Time: 04/19/16  8:12 PM  Result Value Ref Range   Ferritin 154 24 - 336 ng/mL  Troponin I (q 6hr x 3)     Status: Abnormal   Collection Time: 04/19/16  8:12 PM  Result Value Ref Range   Troponin I 0.04 (HH) <0.03 ng/mL  Troponin I (q 6hr x 3)     Status: Abnormal   Collection Time: 04/20/16  1:14 AM  Result Value Ref Range   Troponin I 0.04 (HH) <0.03 ng/mL  Lipid panel     Status: Abnormal   Collection Time: 04/20/16  1:14 AM  Result Value Ref Range   Cholesterol 118 0 - 200 mg/dL   Triglycerides 50 <150 mg/dL   HDL 29  (L) >40 mg/dL   Total CHOL/HDL Ratio 4.1 RATIO   VLDL 10 0 - 40 mg/dL   LDL Cholesterol 79 0 - 99 mg/dL  Troponin I (q 6hr x 3)     Status: Abnormal   Collection Time: 04/20/16  9:45 AM  Result Value Ref Range   Troponin I 0.03 (HH) <0.03 ng/mL    EKG: NSR  IMAGING: Dg Chest 2 View  Result Date: 04/19/2016 CLINICAL DATA:  Shortness of breath. EXAM: CHEST  2 VIEW COMPARISON:  12/19/2012. FINDINGS: Cardiomegaly with pulmonary vascular prominence and  bilateral interstitial prominence with small right pleural effusion. Findings consistent with congestive heart failure . No pneumothorax. No acute bony abnormality. IMPRESSION: Congestive heart failure with pulmonary interstitial edema and small right pleural effusion. Electronically Signed   By: Marcello Moores  Register   On: 04/19/2016 12:25   US Renal  Result Date: 04/20/2016 CLINICAL DATA:  Chronic renal failure EXAM: RENAL / URINARY TRACT ULTRASOUND COMPLETE COMPARISON:  CT abdomen and pelvis Oct 29, 2012 FINDINGS: Right Kidney: Length: 11.5 cm. Echogenicity is increased. The renal cortical thickness is within normal limits. No perinephric fluid or hydronephrosis visualized. There is a a cyst arising from the upper pole the right kidney measuring 1.4 x 0.8 x 1.7 cm. A cyst more inferiorly measures 2.4 x 0.8 x 1.4 cm. No sonographically demonstrable calculus or ureterectasis. Left Kidney: Length: 11.9 cm. Echogenicity is increased. Renal cortical thickness is normal. There is a cyst in the mid left kidney measuring 0.9 x 1.1 x 1.2 cm. No perinephric fluid or hydronephrosis visualized. No sonographically demonstrable calculus or ureterectasis. Bladder: Appears normal for degree of bladder distention. There is a right pleural effusion. IMPRESSION: Kidneys are echogenic consistent with medical renal disease. No obstructing foci identified in either kidney. There are small renal cysts bilaterally. There is a right pleural effusion. Electronically Signed   By:  Lowella Grip III M.D.   On: 04/20/2016 09:26    IMPRESSION: Principal Problem:   Acute systolic congestive heart failure (HCC) Active Problems:   Accelerated hypertension   CKD (chronic kidney disease) stage 3, GFR 30-59 ml/min   Morbid obesity ([place])   Hyperlipidemia   Acute renal failure (ARF) (HCC)   RECOMMENDATION: Await echo results but I suspect his EF has not significantly improved from 2013. Diurese per renal service. Agree with Coreg and ASA 81 mg.  Time Spent Directly with Patient: [address] minutes  Kerin Ransom, [place] beeper 04/20/2016, 4:42 PM   Attending Note:   The patient was seen and examined.  Agree with assessment and plan as noted above.  Changes made to the above note as needed.  Patient seen and independently examined with Kerin Ransom .   We discussed all aspects of the encounter. I agree with the assessment and plan as stated above.  1. Acute on Chronic systolic CHF - his last EF is 35%.  Recently moved from [place], [place] Has not had consistent care at all Runs out of meds and then goes to the ER to get them refilled.  Will not be able to use ACE or ARB due to renal dysfunction Agree with hydralazine , nitrates, diuretics, coreg.   2. CKD:  - creatinine is 6 , further assessment per nephrology   3. Morbid obesity:  Needs education  4.   I have spent a total of 30 minutes with patient reviewing hospital  notes , telemetry, EKGs, labs and examining patient as well as establishing an assessment and plan that was discussed with the patient. > 50% of time was spent in direct patient care.    Thayer Headings, Brooke Bonito., MD, [facility] 04/20/2016, 5:40 PM [address]  [place] Pager [phone]

## 2016-04-20 NOTE — Progress Notes (Signed)
*  Preliminary Results* Bilateral lower extremity venous duplex completed. Study was technically limited and difficult due to patient body habitus and depth of vessels. Visualized veins of bilateral lower extremities are negative for deep vein thrombosis. There is no evidence of Baker's cyst bilaterally.  04/20/2016 3:48 PM Maudry Mayhew, BS, RVT, RDCS, RDMS

## 2016-04-20 NOTE — Progress Notes (Signed)
[facility] PROGRESS NOTE  Arlind Klingerman NWG:[id number] DOB: 13-Oct-1960 DOA: 04/19/2016 PCP: No primary care provider on file.  Interim summary and HPI 55 y.o. male With h/o htn, ckd, chf ( last ef 35-40 in 2013), medication noncompliant, presented to [facility] ED with complaints of SOB, DOE and increase edema. Admitted for further management of acute on chronic systolic HF and also further evaluation and treatment of acute on chronic renal failure. Nephrology and cardiology consulted.  Assessment/Plan: 1-SOB: due to acute on chronic systolic CHF -last EF 57-84% -patient poor medication compliance and diet indiscretions -will follow echo -low sodium diet discussed with patient -daily weights and strict intake and output -will use IV lasix (80mg  Q8H), follow diuresis and pay close attention to renal function/electrolytes -will continue coreg and use hydralazine and Imdur -patient unable to use ACe or ARB currently given renal function  -cardiology consulted, will follow rec's -mild troponin elevation in setting of acute on chronic CHF and acute on chornic renal failure; no CP or acute ischemic changes on EKG.   2-acute on chronic renal failure: stage 4 at baseline  -will follow renal US -most likely due to poor perfusion given acute on chronic CHF vs progression of chronic renal failure (due to hypertension) -renal has been consulted -patient still making urine and his UA do not demonstrated UTI -follow renal function trend   3-HTN: uncontrolled -will use lasix, coreg, imdur and hydralazine -low sodium diet ordered  4-mild hyperkalemia and mild metabolic acidosis -due to renal failure most likely -will follow electrolytes trend -on aggressive IV diuretics therapy -will follow clinical response   5-obesity -Body mass index is 50.29 kg/m. -low calorie diet and exercise discussed with patient -most likely with OHS and OSA as contributory factors for his SOB -will benefit of  outpatient sleep apnea assessment   6-Anemia of chronic disease -Hgb 11.9 -will monitor   7-HLD -continue statins  Code Status: Full Family Communication: no family at bedside  Disposition Plan: continue IV diuresis, follow Echo results; follow rec's from renal service and cardiology. Anticipate discharge home when medically stable.   Consultants:  Nephrology  Cardiology   Procedures:  2-D echo pending  Renal US pending  See below for x-ray reports   Antibiotics:  None   HPI/Subjective: Afebrile, with some improvement in his SOB, denies CP. Patient with significant fluid overload findings on exam.  Objective: Vitals:   04/20/16 0802 04/20/16 0911  BP: (!) 167/110 (!) 162/100  Pulse: 84   Resp: 20   Temp: 97.7 F (36.5 C)     Intake/Output Summary (Last 24 hours) at 04/20/16 1016 Last data filed at 04/20/16 0900  Gross per 24 hour  Intake             1020 ml  Output             1925 ml  Net             -905 ml   Filed Weights   04/19/16 1726 04/20/16 0607  Weight: (!) 168.2 kg (370 lb 13 oz) (!) 168.2 kg (370 lb 13 oz)    Exam:   General:  Obese, in no distress male; reports no CP currently and overall endorses mild improvement in his SOB.  Cardiovascular: S1 and S2, no rubs, no gallops, no murmurs, positive JVD  Respiratory: bibasilar crackles, no wheezing   Abdomen: obese, soft, NT, ND, positive BS  Musculoskeletal: 2-3+ edema bilaterally, no cyanosis   Data Reviewed: Basic Metabolic  Panel:  Recent Labs Lab 04/19/16 1241 04/19/16 1835 04/19/16 2012  NA 142  --   --   K 5.3*  --   --   CL 117*  --   --   CO2 19*  --   --   GLUCOSE 132*  --   --   BUN 81*  --   --   CREATININE 6.16* 6.24*  --   CALCIUM 8.2*  --   --   PHOS  --   --  4.6   CBC:  Recent Labs Lab 04/19/16 1241 04/19/16 1835  WBC 8.6 8.2  HGB 11.9* 11.7*  HCT 37.2* 36.9*  MCV 82.5 82.9  PLT 163 158   Cardiac Enzymes:  Recent Labs Lab 04/19/16 2012  04/20/16 0114  TROPONINI 0.04* 0.04*   BNP (last 3 results)  Recent Labs  04/19/16 1241  BNP 2,132.3*   Studies: Dg Chest 2 View  Result Date: 04/19/2016 CLINICAL DATA:  Shortness of breath. EXAM: CHEST  2 VIEW COMPARISON:  12/19/2012. FINDINGS: Cardiomegaly with pulmonary vascular prominence and bilateral interstitial prominence with small right pleural effusion. Findings consistent with congestive heart failure . No pneumothorax. No acute bony abnormality. IMPRESSION: Congestive heart failure with pulmonary interstitial edema and small right pleural effusion. Electronically Signed   By: Marcello Moores  Register   On: 04/19/2016 12:25   US Renal  Result Date: 04/20/2016 CLINICAL DATA:  Chronic renal failure EXAM: RENAL / URINARY TRACT ULTRASOUND COMPLETE COMPARISON:  CT abdomen and pelvis Oct 29, 2012 FINDINGS: Right Kidney: Length: 11.5 cm. Echogenicity is increased. The renal cortical thickness is within normal limits. No perinephric fluid or hydronephrosis visualized. There is a a cyst arising from the upper pole the right kidney measuring 1.4 x 0.8 x 1.7 cm. A cyst more inferiorly measures 2.4 x 0.8 x 1.4 cm. No sonographically demonstrable calculus or ureterectasis. Left Kidney: Length: 11.9 cm. Echogenicity is increased. Renal cortical thickness is normal. There is a cyst in the mid left kidney measuring 0.9 x 1.1 x 1.2 cm. No perinephric fluid or hydronephrosis visualized. No sonographically demonstrable calculus or ureterectasis. Bladder: Appears normal for degree of bladder distention. There is a right pleural effusion. IMPRESSION: Kidneys are echogenic consistent with medical renal disease. No obstructing foci identified in either kidney. There are small renal cysts bilaterally. There is a right pleural effusion. Electronically Signed   By: Lowella Grip III M.D.   On: 04/20/2016 09:26    Scheduled Meds: . aspirin EC  81 mg Oral Daily  . carvedilol  12.5 mg Oral BID WC  . feeding  supplement (NEPRO CARB STEADY)  237 mL Oral BID BM  . furosemide  80 mg Intravenous Q8H  . heparin  5,000 Units Subcutaneous Q8H  . hydrALAZINE  50 mg Oral Q8H  . [START ON 04/21/2016] isosorbide mononitrate  60 mg Oral Daily  . pravastatin  40 mg Oral Daily  . sodium chloride flush  3 mL Intravenous Q12H    Time spent: 25 minutes   Barton Dubois  [facility] Pager [phone]. If 7PM-7AM, please contact night-coverage at [email or web address], password [facility] 04/20/2016, 10:16 AM  LOS: 1 day

## 2016-04-21 ENCOUNTER — Other Ambulatory Visit (HOSPITAL_COMMUNITY): Payer: Self-pay

## 2016-04-21 ENCOUNTER — Inpatient Hospital Stay (HOSPITAL_COMMUNITY): Payer: Self-pay

## 2016-04-21 DIAGNOSIS — I10 Essential (primary) hypertension: Secondary | ICD-10-CM

## 2016-04-21 DIAGNOSIS — I509 Heart failure, unspecified: Secondary | ICD-10-CM

## 2016-04-21 DIAGNOSIS — I5021 Acute systolic (congestive) heart failure: Secondary | ICD-10-CM

## 2016-04-21 LAB — RENAL FUNCTION PANEL
Albumin: 3.3 g/dL — ABNORMAL LOW (ref 3.5–5.0)
Anion gap: 9 (ref 5–15)
BUN: 75 mg/dL — ABNORMAL HIGH (ref 6–20)
CALCIUM: 8.4 mg/dL — AB (ref 8.9–10.3)
CO2: 19 mmol/L — AB (ref 22–32)
CREATININE: 6.36 mg/dL — AB (ref 0.61–1.24)
Chloride: 113 mmol/L — ABNORMAL HIGH (ref 101–111)
GFR calc non Af Amer: 9 mL/min — ABNORMAL LOW (ref >60)
GFR, EST AFRICAN AMERICAN: 10 mL/min — AB (ref >60)
GLUCOSE: 108 mg/dL — AB (ref 65–99)
Phosphorus: 4.8 mg/dL — ABNORMAL HIGH (ref 2.5–4.6)
Potassium: 5.1 mmol/L (ref 3.5–5.1)
SODIUM: 141 mmol/L (ref 135–145)

## 2016-04-21 LAB — PROTEIN ELECTROPHORESIS, SERUM
A/G RATIO SPE: 1.3 (ref 0.7–1.7)
ALBUMIN ELP: 3.2 g/dL (ref 2.9–4.4)
ALPHA-1-GLOBULIN: 0.2 g/dL (ref 0.0–0.4)
Alpha-2-Globulin: 0.5 g/dL (ref 0.4–1.0)
BETA GLOBULIN: 0.8 g/dL (ref 0.7–1.3)
Gamma Globulin: 0.9 g/dL (ref 0.4–1.8)
Globulin, Total: 2.4 g/dL (ref 2.2–3.9)
Total Protein ELP: 5.6 g/dL — ABNORMAL LOW (ref 6.0–8.5)

## 2016-04-21 LAB — PARATHYROID HORMONE, INTACT (NO CA): PTH: 440 pg/mL — ABNORMAL HIGH (ref 15–65)

## 2016-04-21 LAB — ECHOCARDIOGRAM COMPLETE
HEIGHTINCHES: 72 in
WEIGHTICAEL: 5888 [oz_av]

## 2016-04-21 LAB — HEMOGLOBIN A1C
Hgb A1c MFr Bld: 5.6 % (ref 4.8–5.6)
MEAN PLASMA GLUCOSE: 114 mg/dL

## 2016-04-21 MED ORDER — CARVEDILOL 25 MG PO TABS
25.0000 mg | ORAL_TABLET | Freq: Two times a day (BID) | ORAL | Status: DC
  Administered 2016-04-21 – 2016-04-23 (×4): 25 mg via ORAL
  Filled 2016-04-21 (×4): qty 1

## 2016-04-21 MED ORDER — HYDRALAZINE HCL 50 MG PO TABS
100.0000 mg | ORAL_TABLET | Freq: Three times a day (TID) | ORAL | Status: DC
  Administered 2016-04-21 – 2016-04-24 (×8): 100 mg via ORAL
  Filled 2016-04-21 (×8): qty 2

## 2016-04-21 NOTE — Plan of Care (Signed)
Problem: Food- and Nutrition-Related Knowledge Deficit (NB-1.1) Goal: Nutrition education Formal process to instruct or train a patient/client in a skill or to impart knowledge to help patients/clients voluntarily manage or modify food choices and eating behavior to maintain or improve health. Outcome: Completed/Met Date Met: 04/21/16 Nutrition Education Note  RD consulted for Renal Education. Provided "Eating Healthy with Kidney Disease" handout to patient. Reviewed food groups and provided written recommended serving sizes specifically determined for patient's current nutritional status.   Explained why diet restrictions are needed and provided lists of foods to limit/avoid that are high potassium, sodium, and phosphorus. Provided specific recommendations on safer alternatives of these foods. Strongly encouraged compliance of this diet. Discussed need for fluid restriction and renal-friendly beverage options. Teach back method used. Noted, pt has Nepro shake ordered. RD to discontinue Nepro Shake as po 100%.   Expect good compliance.  Stephanie Craig, MS, RD, LDN Pager # [phone] After hours/ weekend pager # [phone]     

## 2016-04-21 NOTE — Progress Notes (Signed)
Patient Name: Edward Jordan Date of Encounter: 04/21/2016  Primary Cardiologist: new , [facility] Problem List     Principal Problem:   Acute systolic congestive heart failure ([place]) Active Problems:   Accelerated hypertension   CKD (chronic kidney disease) stage 3, GFR 30-59 ml/min   Morbid obesity (HCC)   Hyperlipidemia   Acute renal failure (ARF) ([place])     Subjective   55 y/o morbidly obese AA male, with a history of HTN, and CM, just moved here from [place] hx of chf.   Does not go to the doctor - just shows up in the ER when he needs refills.   Inpatient Medications    Scheduled Meds: . aspirin EC  81 mg Oral Daily  . carvedilol  25 mg Oral BID WC  . furosemide  80 mg Intravenous Q8H  . heparin  5,000 Units Subcutaneous Q8H  . hydrALAZINE  50 mg Oral Q8H  . isosorbide mononitrate  60 mg Oral Daily  . pravastatin  40 mg Oral Daily  . sodium chloride flush  3 mL Intravenous Q12H   Continuous Infusions:  PRN Meds: sodium chloride, acetaminophen, hydrALAZINE, ondansetron (ZOFRAN) IV, sodium chloride flush   Vital Signs    Vitals:   04/20/16 1724 04/20/16 2028 04/20/16 2113 04/21/16 0640  BP: (!) 158/107 (!) 172/110  (!) 161/103  Pulse: 78 78  74  Resp: 20 (!) 25  20  Temp: 97.3 F (36.3 C) 97.5 F (36.4 C)  97.6 F (36.4 C)  TempSrc: Oral     SpO2: 100% 100%  99%  Weight:   (!) 368 lb (166.9 kg)   Height:        Intake/Output Summary (Last 24 hours) at 04/21/16 1537 Last data filed at 04/21/16 1500  Gross per 24 hour  Intake              840 ml  Output             3250 ml  Net            -2410 ml   Filed Weights   04/19/16 1726 04/20/16 0607 04/20/16 2113  Weight: (!) 370 lb 13 oz (168.2 kg) (!) 370 lb 13 oz (168.2 kg) (!) 368 lb (166.9 kg)    Physical Exam    GEN: Well nourished, well developed, in no acute distress.  HEENT: Grossly normal.  Neck: Supple, no JVD, carotid bruits, or masses. Cardiac: RRR, no murmurs, rubs, or  gallops. No clubbing, cyanosis,  He has 1-2+ lower leg  Radials/DP/PT 2+ and equal bilaterally.  Respiratory:  Respirations regular and unlabored, clear to auscultation bilaterally. GI: Soft, nontender, nondistended, BS + x 4. MS: no deformity or atrophy. Skin: warm and dry, no rash. Neuro:  Strength and sensation are intact. Psych: AAOx3.  Normal affect.  Labs    CBC  Recent Labs  04/19/16 1241 04/19/16 1835  WBC 8.6 8.2  HGB 11.9* 11.7*  HCT 37.2* 36.9*  MCV 82.5 82.9  PLT 163 762   Basic Metabolic Panel  Recent Labs  04/19/16 1241 04/19/16 1835 04/19/16 2012 04/21/16 0559  NA 142  --   --  141  K 5.3*  --   --  5.1  CL 117*  --   --  113*  CO2 19*  --   --  19*  GLUCOSE 132*  --   --  108*  BUN 81*  --   --  75*  CREATININE 6.16* 6.24*  --  6.36*  CALCIUM 8.2*  --   --  8.4*  PHOS  --   --  4.6 4.8*   Liver Function Tests  Recent Labs  04/21/16 0559  ALBUMIN 3.3*   No results for input(s): LIPASE, AMYLASE in the last 72 hours. Cardiac Enzymes  Recent Labs  04/19/16 2012 04/20/16 0114 04/20/16 0945  TROPONINI 0.04* 0.04* 0.03*   BNP Invalid input(s): POCBNP D-Dimer No results for input(s): DDIMER in the last 72 hours. Hemoglobin A1C  Recent Labs  04/20/16 0945  HGBA1C 5.6   Fasting Lipid Panel  Recent Labs  04/20/16 0114  CHOL 118  HDL 29*  LDLCALC 79  TRIG 50  CHOLHDL 4.1   Thyroid Function Tests No results for input(s): TSH, T4TOTAL, T3FREE, THYROIDAB in the last 72 hours.  Invalid input(s): FREET3  Telemetry    NSR  - Personally Reviewed  ECG    NSR  - Personally Reviewed  Radiology    US Renal  Result Date: 04/20/2016 CLINICAL DATA:  Chronic renal failure EXAM: RENAL / URINARY TRACT ULTRASOUND COMPLETE COMPARISON:  CT abdomen and pelvis Oct 29, 2012 FINDINGS: Right Kidney: Length: 11.5 cm. Echogenicity is increased. The renal cortical thickness is within normal limits. No perinephric fluid or hydronephrosis  visualized. There is a a cyst arising from the upper pole the right kidney measuring 1.4 x 0.8 x 1.7 cm. A cyst more inferiorly measures 2.4 x 0.8 x 1.4 cm. No sonographically demonstrable calculus or ureterectasis. Left Kidney: Length: 11.9 cm. Echogenicity is increased. Renal cortical thickness is normal. There is a cyst in the mid left kidney measuring 0.9 x 1.1 x 1.2 cm. No perinephric fluid or hydronephrosis visualized. No sonographically demonstrable calculus or ureterectasis. Bladder: Appears normal for degree of bladder distention. There is a right pleural effusion. IMPRESSION: Kidneys are echogenic consistent with medical renal disease. No obstructing foci identified in either kidney. There are small renal cysts bilaterally. There is a right pleural effusion. Electronically Signed   By: Lowella Grip III M.D.   On: 04/20/2016 09:26    Cardiac Studies     Patient Profile       Assessment & Plan    1.  Acute on Chronic systolic CHF: Diuresing well. He needs to get hooked up with the [facility] and wellness clinic Continue diuresis. Echo pending   2. HTN:  Continue medical management  Increase hydralazine to 100 mg tid    Signed, Mertie Moores, MD  04/21/2016, 3:37 PM

## 2016-04-21 NOTE — Progress Notes (Signed)
PROGRESS NOTE    Edward Jordan  TFT:[id number] DOB: 08/08/1960 DOA: 04/19/2016 PCP: No primary care provider on file.   Brief Narrative:  Patient is a 55 yo male with history of CHF (2013 EF 35-40), CKD stage 5, HTN. Presented to [facility] ED 11/06 with SOB, DOE, and increasing edema. Known medication noncompliance. Admitted for management of acute on chronic CHF and further evaluation and management of acute on chronic renal failure.   Assessment & Plan:   Principal Problem:   Acute systolic congestive heart failure (HCC) Active Problems:   Accelerated hypertension   CKD (chronic kidney disease) stage 3, GFR 30-59 ml/min   Morbid obesity ([place])   Hyperlipidemia   Acute renal failure (ARF) ([place])   Acute on chronic systolic CHF - 7062 EF 37-62% with known medication and diet noncompliance - ECHO results pending - BNP 2,132. Troponin 0.04-0.04-0.03 with negative chest pain or ischemic EKG - Diuresed with IV Lasix. Negative balance of 2,770cc and 2lb since admission.  - Potassium 5.1. Cr 6.36. - Continue Lasix. Continue coreg, hydralazine, imdur. Avoid ACE/ARB. - Follow I/O and daily weights. - Cardiology following.  Acute on chronic renal failure - Stage 4 at baseline. Creatinine 6.36. EGFR 10. - Likely due to poor perfusion in the setting on acute on chronic CHF vs progression of CKD due to hypertension - Renal ultrasound showed small cysts bilaterally. Normal size and not concerning for extracellular tissue deposition associated disease. SPEP pending. - Patient still making urine. - Nephrology following and to establish care.  Uncontrolled hypertension - 161/103 this am. - On lasix, coreg, imdur, hydralzine. - Continue low sodium diet.  - Consider starting Norvasc per nephrology  Mild hyperkalemia and mild metabolic acidosis - Likely due to renal failure - Potassium improved to 5.1 - Continue to follow with aggressive diuresis therapy  Obesity - BMI 50.29 kg/m2 - Low  calorie diet and exercise has been discussed with patient - OHS and OSA likely contributing factors to his SOB - Recommend outpatient sleep apnea assessment  Anemia - Consistent with anemia of chronic kidney disease - Hgb 11.7 - Give Ferraheme x 1 per nephrology  Hyperlipidemia - Continue statins   DVT prophylaxis: Heparin Code Status: Full Family Communication: None at bedside Disposition Plan: Continue IV diuresis. Follow recommendations from Cardiology and Nephrology. Discharge home when medically stable.  Consultants:  Cardiology Nephrology  Procedures:  Renal ultrasound 2-D echo  Antimicrobials:  None   Subjective: Patient appears comfortable sitting in bedside chair. States that his SOB is resolved with leg swelling as his only current symptom.  Denies chest pain, cough, wheeze, SOB, abdominal pain, nausea, vomiting, leg pain.   Objective: Vitals:   04/20/16 1724 04/20/16 2028 04/20/16 2113 04/21/16 0640  BP: (!) 158/107 (!) 172/110  (!) 161/103  Pulse: 78 78  74  Resp: 20 (!) 25  20  Temp: 97.3 F (36.3 C) 97.5 F (36.4 C)  97.6 F (36.4 C)  TempSrc: Oral     SpO2: 100% 100%  99%  Weight:   (!) 166.9 kg (368 lb)   Height:        Intake/Output Summary (Last 24 hours) at 04/21/16 1109 Last data filed at 04/21/16 0900  Gross per 24 hour  Intake              960 ml  Output             2625 ml  Net            -  1665 ml   Filed Weights   04/19/16 1726 04/20/16 0607 04/20/16 2113  Weight: (!) 168.2 kg (370 lb 13 oz) (!) 168.2 kg (370 lb 13 oz) (!) 166.9 kg (368 lb)    Examination:  General exam: Appears calm and comfortable  Respiratory system: Slight expiratory wheeze. Good air movement. Respiratory effort normal. Cardiovascular system: S1 & S2 heard, RRR. No JVD, murmurs, rubs, gallops or clicks. 3+ LE edema to the knee. Gastrointestinal system: Abdomen is nondistended, soft and nontender. No organomegaly or masses felt. Normal bowel sounds  heard. Central nervous system: Alert and oriented. No focal neurological deficits. Extremities: Symmetric 5 x 5 power. Skin: No rashes, lesions or ulcers Psychiatry: Judgement and insight appear normal. Mood & affect appropriate.     Data Reviewed: I have personally reviewed following labs and imaging studies  CBC:  Recent Labs Lab 04/19/16 1241 04/19/16 1835  WBC 8.6 8.2  HGB 11.9* 11.7*  HCT 37.2* 36.9*  MCV 82.5 82.9  PLT 163 376   Basic Metabolic Panel:  Recent Labs Lab 04/19/16 1241 04/19/16 1835 04/19/16 2012 04/21/16 0559  NA 142  --   --  141  K 5.3*  --   --  5.1  CL 117*  --   --  113*  CO2 19*  --   --  19*  GLUCOSE 132*  --   --  108*  BUN 81*  --   --  75*  CREATININE 6.16* 6.24*  --  6.36*  CALCIUM 8.2*  --   --  8.4*  PHOS  --   --  4.6 4.8*   GFR: Estimated Creatinine Clearance: 21.5 mL/min (by C-G formula based on SCr of 6.36 mg/dL (H)). Liver Function Tests:  Recent Labs Lab 04/21/16 0559  ALBUMIN 3.3*   No results for input(s): LIPASE, AMYLASE in the last 168 hours. No results for input(s): AMMONIA in the last 168 hours. Coagulation Profile: No results for input(s): INR, PROTIME in the last 168 hours. Cardiac Enzymes:  Recent Labs Lab 04/19/16 2012 04/20/16 0114 04/20/16 0945  TROPONINI 0.04* 0.04* 0.03*   BNP (last 3 results) No results for input(s): PROBNP in the last 8760 hours. HbA1C:  Recent Labs  04/20/16 0945  HGBA1C 5.6   CBG: No results for input(s): GLUCAP in the last 168 hours. Lipid Profile:  Recent Labs  04/20/16 0114  CHOL 118  HDL 29*  LDLCALC 79  TRIG 50  CHOLHDL 4.1   Thyroid Function Tests: No results for input(s): TSH, T4TOTAL, FREET4, T3FREE, THYROIDAB in the last 72 hours. Anemia Panel:  Recent Labs  04/19/16 2012  FERRITIN 154  TIBC 232*  IRON 41*   Sepsis Labs: No results for input(s): PROCALCITON, LATICACIDVEN in the last 168 hours.  No results found for this or any previous  visit (from the past 240 hour(s)).    Radiology Studies: Dg Chest 2 View  Result Date: 04/19/2016 CLINICAL DATA:  Shortness of breath. EXAM: CHEST  2 VIEW COMPARISON:  12/19/2012. FINDINGS: Cardiomegaly with pulmonary vascular prominence and bilateral interstitial prominence with small right pleural effusion. Findings consistent with congestive heart failure . No pneumothorax. No acute bony abnormality. IMPRESSION: Congestive heart failure with pulmonary interstitial edema and small right pleural effusion. Electronically Signed   By: Marcello Moores  Register   On: 04/19/2016 12:25   US Renal  Result Date: 04/20/2016 CLINICAL DATA:  Chronic renal failure EXAM: RENAL / URINARY TRACT ULTRASOUND COMPLETE COMPARISON:  CT abdomen and pelvis Oct 29, 2012 FINDINGS: Right Kidney: Length: 11.5 cm. Echogenicity is increased. The renal cortical thickness is within normal limits. No perinephric fluid or hydronephrosis visualized. There is a a cyst arising from the upper pole the right kidney measuring 1.4 x 0.8 x 1.7 cm. A cyst more inferiorly measures 2.4 x 0.8 x 1.4 cm. No sonographically demonstrable calculus or ureterectasis. Left Kidney: Length: 11.9 cm. Echogenicity is increased. Renal cortical thickness is normal. There is a cyst in the mid left kidney measuring 0.9 x 1.1 x 1.2 cm. No perinephric fluid or hydronephrosis visualized. No sonographically demonstrable calculus or ureterectasis. Bladder: Appears normal for degree of bladder distention. There is a right pleural effusion. IMPRESSION: Kidneys are echogenic consistent with medical renal disease. No obstructing foci identified in either kidney. There are small renal cysts bilaterally. There is a right pleural effusion. Electronically Signed   By: Lowella Grip III M.D.   On: 04/20/2016 09:26     Scheduled Meds: . aspirin EC  81 mg Oral Daily  . carvedilol  12.5 mg Oral BID WC  . furosemide  80 mg Intravenous Q8H  . heparin  5,000 Units Subcutaneous Q8H   . hydrALAZINE  50 mg Oral Q8H  . isosorbide mononitrate  60 mg Oral Daily  . pravastatin  40 mg Oral Daily  . sodium chloride flush  3 mL Intravenous Q12H   Continuous Infusions:   LOS: 2 days   Time spent: 30 minutes   Filiberto Pinks, PA-S [facility] Pager [phone]  If 7PM-7AM, please contact night-coverage [email or web address] Password [email or web address] 04/21/2016, 11:09 AM   Birdie Hopes Pager: [phone] 04/21/2016, 12:50 PM

## 2016-04-21 NOTE — Progress Notes (Signed)
  Echocardiogram 2D Echocardiogram has been performed.  Edward Jordan 04/21/2016, 3:32 PM

## 2016-04-21 NOTE — Care Management Note (Addendum)
Case Management Note  Patient Details  Name: Edward Jordan MRN: [id number] Date of Birth: December 30, 1960  Subjective/Objective:     CM following for progression and d/c planning.                Action/Plan: 04/21/2016 Met with pt , finance also met with pt re assistance with cost of hospitalization.  Information re cost given to pt by financial counselor. This CM met with pt re d/c planning needs. Per pt no PCP, has recently gone to ED in [place] for prescriptions. No longer able to get meds. A hospital followup appointment has been scheduled at [facility] for overflow as no appointments are available at [facility]. This was discussed with pt and he is agreeable. Will give pt a MATCH letter upon d/c to obtain meds and [place] card was discussed with pt . Pt was encouraged to keep this followup appointment and apply for an [facility] card at the clinic in order to use the pharmacy at the [place] or the county MAP program.  [facility] letter placed in chart to be given to pt at time of d/c.  [facility] ,  Friday, Nov. 17, 2017 @ 9am. Info to be placed in d/c instructions. Pt encouraged to cancell this appointment if he is unable to keep it.   Expected Discharge Date:   (unknown)               Expected Discharge Plan:  Home/Self Care  In-House Referral:  NA  Discharge planning Services  CM Consult, [facility], [facility]  Post Acute Care Choice:  NA Choice offered to:  NA  DME Arranged:   NA DME Agency:   NA  HH Arranged:   NA HH Agency:     Status of Service:  In process, will continue to follow  If discussed at Long Length of Stay Meetings, dates discussed:    Additional Comments:  Adron Bene, RN 04/21/2016, 11:09 AM

## 2016-04-21 NOTE — Progress Notes (Signed)
[facility] Progress Note   Subjective:   Patient denies any dyspnea this AM. Feels that the swelling in his legs is about the same.    Objective:   BP (!) 161/103 Comment: Kami Notified  Pulse 74   Temp 97.6 F (36.4 C)   Resp 20   Ht 6' (1.829 m)   Wt (!) 368 lb (166.9 kg)   SpO2 99%   BMI 49.91 kg/m   Intake/Output Summary (Last 24 hours) at 04/21/16 0350 Last data filed at 04/21/16 0900  Gross per 24 hour  Intake              960 ml  Output             2825 ml  Net            -1865 ml   Weight change: -2 lb 13 oz (-1.276 kg)  Physical Exam: KXF:GHWEXHB up the chair, NAD  CVS:RRR, no murmurs noted  Resp: Slight crackles on the right side, no increased WOB, on room air Abd: BS+, no ttp  Ext: 3+ pitting edema to the knee, 1 + thigh edema  Imaging: Dg Chest 2 View  Result Date: 04/19/2016 CLINICAL DATA:  Shortness of breath. EXAM: CHEST  2 VIEW COMPARISON:  12/19/2012. FINDINGS: Cardiomegaly with pulmonary vascular prominence and bilateral interstitial prominence with small right pleural effusion. Findings consistent with congestive heart failure . No pneumothorax. No acute bony abnormality. IMPRESSION: Congestive heart failure with pulmonary interstitial edema and small right pleural effusion. Electronically Signed   By: Marcello Moores  Register   On: 04/19/2016 12:25   US Renal  Result Date: 04/20/2016 CLINICAL DATA:  Chronic renal failure EXAM: RENAL / URINARY TRACT ULTRASOUND COMPLETE COMPARISON:  CT abdomen and pelvis Oct 29, 2012 FINDINGS: Right Kidney: Length: 11.5 cm. Echogenicity is increased. The renal cortical thickness is within normal limits. No perinephric fluid or hydronephrosis visualized. There is a a cyst arising from the upper pole the right kidney measuring 1.4 x 0.8 x 1.7 cm. A cyst more inferiorly measures 2.4 x 0.8 x 1.4 cm. No sonographically demonstrable calculus or ureterectasis. Left Kidney: Length: 11.9 cm. Echogenicity is increased. Renal  cortical thickness is normal. There is a cyst in the mid left kidney measuring 0.9 x 1.1 x 1.2 cm. No perinephric fluid or hydronephrosis visualized. No sonographically demonstrable calculus or ureterectasis. Bladder: Appears normal for degree of bladder distention. There is a right pleural effusion. IMPRESSION: Kidneys are echogenic consistent with medical renal disease. No obstructing foci identified in either kidney. There are small renal cysts bilaterally. There is a right pleural effusion. Electronically Signed   By: Lowella Grip III M.D.   On: 04/20/2016 09:26   Lab Results  Component Value Date   CREATININE 6.36 (H) 04/21/2016   CREATININE 6.24 (H) 04/19/2016   CREATININE 6.16 (H) 04/19/2016    Recent Labs Lab 04/19/16 1241 04/19/16 1835 04/19/16 2012 04/21/16 0559  NA 142  --   --  141  K 5.3*  --   --  5.1  CL 117*  --   --  113*  CO2 19*  --   --  19*  GLUCOSE 132*  --   --  108*  BUN 81*  --   --  75*  CREATININE 6.16* 6.24*  --  6.36*  CALCIUM 8.2*  --   --  8.4*  PHOS  --   --  4.6 4.8*     Recent Labs Lab  04/19/16 1241 04/19/16 1835  WBC 8.6 8.2  HGB 11.9* 11.7*  HCT 37.2* 36.9*  MCV 82.5 82.9  PLT 163 158    Medications:    . aspirin EC  81 mg Oral Daily  . carvedilol  12.5 mg Oral BID WC  . feeding supplement (NEPRO CARB STEADY)  237 mL Oral BID BM  . furosemide  80 mg Intravenous Q8H  . heparin  5,000 Units Subcutaneous Q8H  . hydrALAZINE  50 mg Oral Q8H  . isosorbide mononitrate  60 mg Oral Daily  . pravastatin  40 mg Oral Daily  . sodium chloride flush  3 mL Intravenous Q12H    Background: Edward Jordan is an 55 y.o. male with pmhx HTN, CKD, and HFrEF ( EF 35-40%) presenting for SOB and hypertensive emergency likely with CHF exacerbation. In 2013/2014 was noted to have CKD4; now with worsening kidney function given non-compliance with antihypertensive medications over the past 6 months (unable to get uinsurance).   Assessment/ Plan:    1. CKD  5 w proteinuria likely due to hx of uncontrolled HTN, not currently followed by nephrology/ per patient did not realize he had CKD. Renal ultrasound with small cysts bilaterally, kidney size 11.5 cm/ 11.9 cm right and left respectively. Normal sized kidney's not concerning extracellular tissue deposition associated disease.SPEP currently pending.  - SCr 6.25> 6.36, stable elevated SCr, no changes over the last 24 hours.  - Volume status: Remains fluid overloaded on exam, currently on lasix 80 mg IV TID, UOP 2.425L  over the past 24 hours; would continue current lasix regiment for now  - continue diuresis  - Electrolytes: Hyperkalemia resolved; K 5.1 today  - Establish care with nephrologist, dialysis education video provided previously (Dr. Mercy Moore plans to follow in the outpt setting); not currently interested in vein mapping/planning for AV fistula   2. Anemia: Hgb 11.7. Iron 41 Saturation Ratio 18. Provide Ferraheme x 1  3. Metabolic Bone Disease: PTH pending. Phosphorus 4.8. Follow up to determine if supplementation is needed   4. HTN - "home medications metolazone 2.5 mg, hydralazine 50 mg daily, enalapril 20 mg, lopressor 100 mg". Has not taken these medications in 6 months.   -  Current blood pressure medications include IMDUR 60 mg, hydralazine 50 mg TID, 12.5 mg Coreg BID- Remains hypertensive, consider increasing Coreg 25 mg BID   5. CHF: ECHO (EF35-40%, 2013) BNP 2,132. Receiving diuresis. Echo pending. Troponin slightly elevated. Cardiology consulted     Kerrin Mo, MD 04/21/2016, 9:22 AM   I have seen and examined this patient and agree with plan and assessment in the above note with renal recommendations/interventions highlighted. Making urine with IV lasix. Weight down a couple of pounds, less dyspneic. (probably has 50 lb extra fluid on board...).  He watched the dialysis videos.  Advised him that he is Stage 5 CKD and we could take advantage of this hospitalization for  "preparatory work" (vein mapping/access) and he politely refused. Will continue to follow.  Maxten Shuler B,MD 04/21/2016 1:10 PM

## 2016-04-22 DIAGNOSIS — N183 Chronic kidney disease, stage 3 (moderate): Secondary | ICD-10-CM

## 2016-04-22 DIAGNOSIS — E875 Hyperkalemia: Secondary | ICD-10-CM

## 2016-04-22 DIAGNOSIS — I509 Heart failure, unspecified: Secondary | ICD-10-CM

## 2016-04-22 LAB — BASIC METABOLIC PANEL
ANION GAP: 8 (ref 5–15)
BUN: 76 mg/dL — AB (ref 6–20)
CO2: 19 mmol/L — ABNORMAL LOW (ref 22–32)
Calcium: 8 mg/dL — ABNORMAL LOW (ref 8.9–10.3)
Chloride: 114 mmol/L — ABNORMAL HIGH (ref 101–111)
Creatinine, Ser: 6.37 mg/dL — ABNORMAL HIGH (ref 0.61–1.24)
GFR calc Af Amer: 10 mL/min — ABNORMAL LOW (ref >60)
GFR, EST NON AFRICAN AMERICAN: 9 mL/min — AB (ref >60)
Glucose, Bld: 103 mg/dL — ABNORMAL HIGH (ref 65–99)
POTASSIUM: 4.6 mmol/L (ref 3.5–5.1)
SODIUM: 141 mmol/L (ref 135–145)

## 2016-04-22 MED ORDER — CALCITRIOL 0.25 MCG PO CAPS
0.2500 ug | ORAL_CAPSULE | Freq: Every day | ORAL | Status: DC
  Administered 2016-04-23 – 2016-04-24 (×2): 0.25 ug via ORAL
  Filled 2016-04-22 (×2): qty 1

## 2016-04-22 MED ORDER — CALCITRIOL 0.25 MCG PO CAPS
0.2500 ug | ORAL_CAPSULE | ORAL | Status: DC
  Administered 2016-04-22: 0.25 ug via ORAL
  Filled 2016-04-22: qty 1

## 2016-04-22 NOTE — Progress Notes (Signed)
PROGRESS NOTE    Edward Jordan  TGP:[id number] DOB: 12/15/1960 DOA: 04/19/2016 PCP: No primary care provider on file.   Brief Narrative:  Patient is a 55 yo male with history of CHF (2013 EF 35-40), CKD stage 4/5, HTN. Presented to [facility] ED 11/06 with SOB, DOE, and increasing edema. Known medication noncompliance. Admitted for management of acute on chronic CHF and further evaluation and management of acute on chronic renal failure.    Assessment & Plan:   Principal Problem:   Acute systolic congestive heart failure (HCC) Active Problems:   Accelerated hypertension   CKD (chronic kidney disease) stage 3, GFR 30-59 ml/min   Morbid obesity ([place])   Hyperlipidemia   Acute renal failure (ARF) ([place])   Essential hypertension  Acute on chronic systolic CHF - 3094 EF 07-68% with known medication and diet noncompliance - 04/21/2016 EF 08-81%, grade 2 diastolic dysfunction - BNP 2,132. Troponin 0.04-0.04-0.03 with negative chest pain or ischemic EKG - Diuresing with IV Lasix. Negative balance 5,792cc since admission. 3+ pedal edema persists. - Continue diuresis. Elevate legs. - Follow I/O and daily weights. - Cardiology recommends outpatient follow-up with them or [facility] and [facility].  Acute on chronic renal failure - Stage 5 at baseline. Creatinine 6.37, EGFR 10 - Likely due to poor perfusion in the setting on acute on chronic CHF vs progression of CKD due to hypertension - Renal ultrasound showed small cysts bilaterally. Normal size and not concerning for extracellular tissue deposition associated disease. - Patient making urine. - Nephrology following and to establish care. - Vein mapping recommended to patient for dialysis preparation. Patient declines at this time.  Uncontrolled hypertension - BP improved 156/87 this am. - Continue lasix, coreg, imdur. Hydralazine increased to 127m TID. Avoid ACE/ARB - Continue low sodium diet.  - Consider starting Norvasc  per nephrology. Hold for now.   Mild hyperkalemia and mild metabolic acidosis - Likely due to renal failure - Potassium improved to 4.6 - Continue to follow with aggressive diuresis therapy  Obesity - BMI 50.29 kg/m2 - Low calorie diet and exercise has been discussed with patient - OHS and OSA likely contributing factors to his SOB - Recommend outpatient sleep apnea assessment  Anemia - Consistent with anemia of chronic kidney disease - Hgb 11.7  Hyperlipidemia - Continue statins   DVT prophylaxis: Heparin Code Status: Full Family Communication: None at bedside Disposition Plan: Continue IV diuresis. Discharge home when medically stable.  Consultants:  Cardiology Nephrology  Procedures:  Renal ultrasound 2-D echo  Antimicrobials:  None  Subjective: Patient appears comfortable sitting in chair and in no acute distress. He has been sleeping in the bedside chair for comfort. Continues to deny SOB while lying down. Denies any complaints.  Denies chest pain, cough, wheeze, SOB, abdominal pain, nausea, vomiting, leg pain.  Objective: Vitals:   04/21/16 1759 04/21/16 2059 04/22/16 0512 04/22/16 0913  BP: (!) 144/102 127/86 (!) 153/85 (!) 156/87  Pulse: 73 71 71 79  Resp: '20 20 19 18  ' Temp: 97.3 F (36.3 C) 97.6 F (36.4 C) 97.8 F (36.6 C) 97.4 F (36.3 C)  TempSrc: Oral Oral Oral Oral  SpO2: 98% 99% 100% 100%  Weight:  (!) 167.2 kg (368 lb 9.8 oz)    Height:        Intake/Output Summary (Last 24 hours) at 04/22/16 1047 Last data filed at 04/22/16 0950  Gross per 24 hour  Intake  603 ml  Output             3625 ml  Net            -3022 ml   Filed Weights   04/20/16 0607 04/20/16 2113 04/21/16 2059  Weight: (!) 168.2 kg (370 lb 13 oz) (!) 166.9 kg (368 lb) (!) 167.2 kg (368 lb 9.8 oz)    Examination:  General exam: Appears calm and comfortable  Respiratory system: Clear to auscultation. Respiratory effort normal. Cardiovascular  system: S1 & S2 heard, RRR. No JVD, murmurs, rubs, gallops or clicks. 3+ LE edema to the knee. Gastrointestinal system: Abdomen is nondistended, soft and nontender. No organomegaly or masses felt. Normal bowel sounds heard. Central nervous system: Alert and oriented. No focal neurological deficits. Extremities: Symmetric 5 x 5 power. Skin: No rashes, lesions or ulcers Psychiatry: Judgement and insight appear normal. Mood & affect appropriate.     Data Reviewed: I have personally reviewed following labs and imaging studies  CBC:  Recent Labs Lab 04/19/16 1241 04/19/16 1835  WBC 8.6 8.2  HGB 11.9* 11.7*  HCT 37.2* 36.9*  MCV 82.5 82.9  PLT 163 267   Basic Metabolic Panel:  Recent Labs Lab 04/19/16 1241 04/19/16 1835 04/19/16 2012 04/21/16 0559 04/22/16 0437  NA 142  --   --  141 141  K 5.3*  --   --  5.1 4.6  CL 117*  --   --  113* 114*  CO2 19*  --   --  19* 19*  GLUCOSE 132*  --   --  108* 103*  BUN 81*  --   --  75* 76*  CREATININE 6.16* 6.24*  --  6.36* 6.37*  CALCIUM 8.2*  --   --  8.4* 8.0*  PHOS  --   --  4.6 4.8*  --    GFR: Estimated Creatinine Clearance: 21.5 mL/min (by C-G formula based on SCr of 6.37 mg/dL (H)). Liver Function Tests:  Recent Labs Lab 04/21/16 0559  ALBUMIN 3.3*   No results for input(s): LIPASE, AMYLASE in the last 168 hours. No results for input(s): AMMONIA in the last 168 hours. Coagulation Profile: No results for input(s): INR, PROTIME in the last 168 hours. Cardiac Enzymes:  Recent Labs Lab 04/19/16 2012 04/20/16 0114 04/20/16 0945  TROPONINI 0.04* 0.04* 0.03*   BNP (last 3 results) No results for input(s): PROBNP in the last 8760 hours. HbA1C:  Recent Labs  04/20/16 0945  HGBA1C 5.6   CBG: No results for input(s): GLUCAP in the last 168 hours. Lipid Profile:  Recent Labs  04/20/16 0114  CHOL 118  HDL 29*  LDLCALC 79  TRIG 50  CHOLHDL 4.1   Thyroid Function Tests: No results for input(s): TSH,  T4TOTAL, FREET4, T3FREE, THYROIDAB in the last 72 hours. Anemia Panel:  Recent Labs  04/19/16 2012  FERRITIN 154  TIBC 232*  IRON 41*   Sepsis Labs: No results for input(s): PROCALCITON, LATICACIDVEN in the last 168 hours.  No results found for this or any previous visit (from the past 240 hour(s)).     Radiology Studies: No results found.    Scheduled Meds: . aspirin EC  81 mg Oral Daily  . carvedilol  25 mg Oral BID WC  . furosemide  80 mg Intravenous Q8H  . heparin  5,000 Units Subcutaneous Q8H  . hydrALAZINE  100 mg Oral Q8H  . isosorbide mononitrate  60 mg Oral Daily  . pravastatin  40  mg Oral Daily  . sodium chloride flush  3 mL Intravenous Q12H   Continuous Infusions: None   LOS: 3 days    Time spent: 30 minutes   Filiberto Pinks, PA-S [facility] Pager [phone]  If 7PM-7AM, please contact night-coverage [email or web address] Password [email or web address] 04/22/2016, 10:47 AM

## 2016-04-22 NOTE — Progress Notes (Signed)
[facility] Progress Note   Subjective:   Patient denies any dyspnea this AM. Feels that the swelling in his legs is about the same.    Objective:   BP (!) 153/85 (BP Location: Right Arm)   Pulse 71   Temp 97.8 F (36.6 C) (Oral)   Resp 19   Ht 6' (1.829 m)   Wt (!) 368 lb 9.8 oz (167.2 kg)   SpO2 100%   BMI 49.99 kg/m   Intake/Output Summary (Last 24 hours) at 04/22/16 0811 Last data filed at 04/22/16 0720  Gross per 24 hour  Intake              840 ml  Output             3500 ml  Net            -2660 ml   Weight change: 9.8 oz (0.276 kg)  Physical Exam: OFB:PZWCHEN up the chair, NAD  CVS:RRR, no murmurs noted  Resp: Slight bibasilar crackles still present, no increased WOB, on room air Abd: BS+, no ttp  Ext: 3+ pitting edema to the knee, 1 + thigh edema  Imaging: No results found. Lab Results  Component Value Date   CREATININE 6.37 (H) 04/22/2016   CREATININE 6.36 (H) 04/21/2016   CREATININE 6.24 (H) 04/19/2016    Recent Labs Lab 04/19/16 1241 04/19/16 1835 04/19/16 2012 04/21/16 0559 04/22/16 0437  NA 142  --   --  141 141  K 5.3*  --   --  5.1 4.6  CL 117*  --   --  113* 114*  CO2 19*  --   --  19* 19*  GLUCOSE 132*  --   --  108* 103*  BUN 81*  --   --  75* 76*  CREATININE 6.16* 6.24*  --  6.36* 6.37*  CALCIUM 8.2*  --   --  8.4* 8.0*  PHOS  --   --  4.6 4.8*  --      Recent Labs Lab 04/19/16 1241 04/19/16 1835  WBC 8.6 8.2  HGB 11.9* 11.7*  HCT 37.2* 36.9*  MCV 82.5 82.9  PLT 163 158    Medications:    . aspirin EC  81 mg Oral Daily  . carvedilol  25 mg Oral BID WC  . furosemide  80 mg Intravenous Q8H  . heparin  5,000 Units Subcutaneous Q8H  . hydrALAZINE  100 mg Oral Q8H  . isosorbide mononitrate  60 mg Oral Daily  . pravastatin  40 mg Oral Daily  . sodium chloride flush  3 mL Intravenous Q12H    Background: Edward Jordan is an 55 y.o. male with pmhx HTN, CKD, and HFrEF ( EF 35-40%) presenting for SOB and  hypertensive emergency likely with CHF exacerbation. In 2013/2014 was noted to have CKD4; now with worsening kidney function given non-compliance with antihypertensive medications over the past 6 months (unable to get uinsurance).   Assessment/ Plan:    1. CKD 5 w proteinuria likely due to hx of uncontrolled HTN, not currently followed by nephrology/ per patient did not realize he had CKD. Renal ultrasound with small cysts bilaterally, kidney size 11.5 cm/ 11.9 cm right and left respectively. Normal sized kidney's not concerning extracellular tissue deposition associated disease.SPEP wnl.  - SCr 6.36>6.37, stable elevated SCr, no changes over the last 24 hours.  - Volume status: Remains fluid overloaded on exam, currently on lasix 80 mg IV TID, UOP 3.25 L  over the past 24 hours;  Would continue current lasix regiment for now  - continue diuresis  - Electrolytes: K 5.1 >4.6 - Establish care with nephrologist, dialysis education video provided previously (Dr. Mercy Moore plans to follow in the outpt setting if pt willing to proceed with dialysis planning); patient remains uninterested in vein mapping/planning for AV fistula   2. Anemia: Hgb 11.7. Iron 41 Saturation Ratio 18. s/p Ferraheme x 1  3. Metabolic Bone Disease: PTH  440 Phosphorus 4.8 - Will get vitamin D level - Consider starting calcitriol 0.25 mg T, TH, Sa Start calcitriol 0.25 mcg/day  4. HTN - "home medications metolazone 2.5 mg, hydralazine 50 mg daily, enalapril 20 mg, lopressor 100 mg". Has not taken these medications in 6 months.   -  Current blood pressure medications include IMDUR 60 mg, hydralazine100 mg TID, Coreg 25 mg BID  5. HFrEF: ECHO (EF35-40% 2017), with  BNP 2,132. Receiving diuresis. Troponin slightly elevated. Cardiology consulted    Kerrin Mo, MD 04/22/2016, 8:11 AM   I have seen and examined this patient and agree with plan and assessment in the above note with renal recommendations/intervention highlighted.   I spent some time with patient going over my concerns about the severity of his renal failure (GFR of 10), and that with the lack of vascular access we will likely at some point (in the not too distant future) be dialyzing him in an emergent situation.  I discussed the mechanics of AVF creation, the maturation time, the possible need for catheter if immature AVF. He remains unwilling to proceed with access planning and keeps going back to a statement about "continuing to monitor right now" and "going to followup with another doctor next week to find out the status of things". I told him he is presently surrounded by internists and nephrologists who are encouraging him to proceed and trying to explain his renal situation to him . Unless/until he can become invested in the program, his nephrology followup is somewhat futile.     Tyriana Helmkamp B,MD 04/22/2016 4:08 PM

## 2016-04-22 NOTE — Progress Notes (Signed)
Patient Name: Edward Jordan Date of Encounter: 04/22/2016  Primary Cardiologist: new , [facility] Problem List     Principal Problem:   Acute systolic congestive heart failure ([place]) Active Problems:   Accelerated hypertension   CKD (chronic kidney disease) stage 3, GFR 30-59 ml/min   Morbid obesity ([place])   Hyperlipidemia   Acute renal failure (ARF) ([place])   Essential hypertension     Subjective   55 y/o morbidly obese AA male, with a history of HTN, and CM, just moved here from [place] hx of chf.   Does not go to the doctor - just shows up in the ER when he needs refills.   Inpatient Medications    Scheduled Meds: . aspirin EC  81 mg Oral Daily  . carvedilol  25 mg Oral BID WC  . furosemide  80 mg Intravenous Q8H  . heparin  5,000 Units Subcutaneous Q8H  . hydrALAZINE  100 mg Oral Q8H  . isosorbide mononitrate  60 mg Oral Daily  . pravastatin  40 mg Oral Daily  . sodium chloride flush  3 mL Intravenous Q12H   Continuous Infusions:  PRN Meds: sodium chloride, acetaminophen, hydrALAZINE, ondansetron (ZOFRAN) IV, sodium chloride flush   Vital Signs    Vitals:   04/21/16 0640 04/21/16 1759 04/21/16 2059 04/22/16 0512  BP: (!) 161/103 (!) 144/102 127/86 (!) 153/85  Pulse: 74 73 71 71  Resp: 20 20 20 19   Temp: 97.6 F (36.4 C) 97.3 F (36.3 C) 97.6 F (36.4 C) 97.8 F (36.6 C)  TempSrc:  Oral Oral Oral  SpO2: 99% 98% 99% 100%  Weight:   (!) 368 lb 9.8 oz (167.2 kg)   Height:        Intake/Output Summary (Last 24 hours) at 04/22/16 0719 Last data filed at 04/22/16 0512  Gross per 24 hour  Intake              840 ml  Output             3250 ml  Net            -2410 ml   Filed Weights   04/20/16 0607 04/20/16 2113 04/21/16 2059  Weight: (!) 370 lb 13 oz (168.2 kg) (!) 368 lb (166.9 kg) (!) 368 lb 9.8 oz (167.2 kg)    Physical Exam    GEN: Well nourished, well developed, in no acute distress.  HEENT: Grossly normal.  Neck: Supple, no  JVD, carotid bruits, or masses. Cardiac: RRR, no murmurs, rubs, or gallops. No clubbing, cyanosis,  He has 1-2+ lower leg  Radials/DP/PT 2+ and equal bilaterally.  Respiratory:  Respirations regular and unlabored, clear to auscultation bilaterally. GI: Soft, nontender, nondistended, BS + x 4. MS: no deformity or atrophy. Skin: warm and dry, no rash. Neuro:  Strength and sensation are intact. Psych: AAOx3.  Normal affect.  Labs    CBC  Recent Labs  04/19/16 1241 04/19/16 1835  WBC 8.6 8.2  HGB 11.9* 11.7*  HCT 37.2* 36.9*  MCV 82.5 82.9  PLT 163 443   Basic Metabolic Panel  Recent Labs  04/19/16 2012 04/21/16 0559 04/22/16 0437  NA  --  141 141  K  --  5.1 4.6  CL  --  113* 114*  CO2  --  19* 19*  GLUCOSE  --  108* 103*  BUN  --  75* 76*  CREATININE  --  6.36* 6.37*  CALCIUM  --  8.4* 8.0*  PHOS 4.6 4.8*  --    Liver Function Tests  Recent Labs  04/21/16 0559  ALBUMIN 3.3*   No results for input(s): LIPASE, AMYLASE in the last 72 hours. Cardiac Enzymes  Recent Labs  04/19/16 2012 04/20/16 0114 04/20/16 0945  TROPONINI 0.04* 0.04* 0.03*   BNP Invalid input(s): POCBNP D-Dimer No results for input(s): DDIMER in the last 72 hours. Hemoglobin A1C  Recent Labs  04/20/16 0945  HGBA1C 5.6   Fasting Lipid Panel  Recent Labs  04/20/16 0114  CHOL 118  HDL 29*  LDLCALC 79  TRIG 50  CHOLHDL 4.1   Thyroid Function Tests No results for input(s): TSH, T4TOTAL, T3FREE, THYROIDAB in the last 72 hours.  Invalid input(s): FREET3  Telemetry    NSR  - Personally Reviewed  ECG    NSR  - Personally Reviewed  Radiology    No results found.  Cardiac Studies     Patient Profile       Assessment & Plan    1.  Acute on Chronic systolic CHF: Diuresing well.  Continue diuresis. Echo is unchaged from previous reports  EF 35-40%. Grade 2 diastolic dysfunction Mild MR , LAE,  He needs to  Follow up with  [facility] wellness  clinic.  I can see him if needed but in the past he has not gone to doctor visits - Hopefully he will keep his appts with [facility].    2. HTN:  Continue medical management    hydralazine to 100 mg tid  BP is actually good at this point  Follow up with [facility]  3. CKD:   Will need follow up from nephology   Will sign off. Call for questions  Signed, Mertie Moores, MD  04/22/2016, 7:19 AM

## 2016-04-23 LAB — BASIC METABOLIC PANEL
ANION GAP: 9 (ref 5–15)
BUN: 74 mg/dL — ABNORMAL HIGH (ref 6–20)
CHLORIDE: 113 mmol/L — AB (ref 101–111)
CO2: 20 mmol/L — ABNORMAL LOW (ref 22–32)
Calcium: 8.3 mg/dL — ABNORMAL LOW (ref 8.9–10.3)
Creatinine, Ser: 6.53 mg/dL — ABNORMAL HIGH (ref 0.61–1.24)
GFR calc non Af Amer: 9 mL/min — ABNORMAL LOW (ref >60)
GFR, EST AFRICAN AMERICAN: 10 mL/min — AB (ref >60)
Glucose, Bld: 94 mg/dL (ref 65–99)
POTASSIUM: 4.5 mmol/L (ref 3.5–5.1)
SODIUM: 142 mmol/L (ref 135–145)

## 2016-04-23 LAB — CALCITRIOL (1,25 DI-OH VIT D): VIT D 1 25 DIHYDROXY: 12.6 pg/mL — AB (ref 19.9–79.3)

## 2016-04-23 MED ORDER — CARVEDILOL 25 MG PO TABS
50.0000 mg | ORAL_TABLET | Freq: Two times a day (BID) | ORAL | Status: DC
  Administered 2016-04-23 – 2016-04-24 (×2): 50 mg via ORAL
  Filled 2016-04-23 (×2): qty 2

## 2016-04-23 NOTE — Progress Notes (Signed)
PROGRESS NOTE    Edward Jordan  CLE:[id number] DOB: 1960-10-29 DOA: 04/19/2016 PCP: No primary care provider on file.  Subjective: Patient is sitting comfortably in bedside chair. No acute distress. Reports having his legs elevated last night. Denies any complaint and feel his SOB is improved.  Patient has 8.7 L negative since admission and his weight down 14 pounds, declined any plans for HD. Nephrology please advise if it is time to switch to oral diuretics to consider discharge over the weekend.  Brief Narrative:  Patient is a 55 yo male with history of CHF (EF 35-40), CKD stage 5, HTN. Presented to [facility] 11/06 with SOB, DOE, and increasing edema. Known medication noncompliance. Admitted for management of acute on chronic CHF and further evaluation and management of acute on chronic renal failure. Patient continues to decline interventions in preparation for hemodialysis, despite multiple recommendations from his medical team.   Assessment & Plan:   Principal Problem:   Acute systolic congestive heart failure ([place]) Active Problems:   Accelerated hypertension   CKD (chronic kidney disease) stage 3, GFR 30-59 ml/min   Morbid obesity ([place])   Hyperlipidemia   Acute renal failure (ARF) ([place])   Essential hypertension   Congestive heart failure ([place])   Hyperkalemia   Acute on chronic systolic CHF - 9449 EF 67-59% with known medication and diet noncompliance - 04/21/2016 EF 16-38%, grade 2 diastolic dysfunction - BNP 2,132. Troponin 0.04-0.04-0.03 with negative chest pain or ischemic EKG - Diuresing with IV Lasix. Negative balance 8,774cc since admission. Current 3+ pedal edema - Continue to elevate legs and follow I/O, daily weights - Cardiology recommends outpatient follow-up with them or [facility] - Consider transition to Lasix PO to prepare for discharge  Acute on chronic renal failure - Stage 5 at baseline. Creatinine 6.53, EGFR 10 - Likely  due to poor perfusion in the setting on acute on chronic CHF vs progression of CKD due to hypertension - Renal ultrasound showed small cysts bilaterally. Normal size and not concerning for extracellular tissue deposition associated disease - Patient making urine - Nephrology has been following and hoping to establish care - Patient continues to decline the medical team's recommendation to prepare for hemodialysis - Discharge with improved volume status and repeated recommendation to prepare for HD  Uncontrolled hypertension - BP 108/60 this am - Continue lasix, coreg, imdur, hydralazine. Avoid ACE/ARB - Continue low sodium diet.   Mild hyperkalemia and mild metabolic acidosis - Likely due to renal failure - Potassium corrected to 4.5  Obesity - BMI 50.29 kg/m2 - Low calorie diet and exercise has been discussed with patient - OHS and OSA likely contributing factors to his SOB - Recommend outpatient sleep apnea assessment  Anemia - Consistent with anemia of chronic kidney disease - Hgb 11.7  Hyperlipidemia - Continue statins   DVT prophylaxis:Heparin Code Status:Full Family Communication:None at bedside Disposition Plan:Discharge home when medically stable   Consultants:   Nephrology  Cardiology  Procedures: Renal ultrasound 2-D echo  Antimicrobials:  None    Objective: Vitals:   04/23/16 0447 04/23/16 0501 04/23/16 0759 04/23/16 0959  BP: (!) 167/98 (!) 156/92 (!) 160/90 108/60  Pulse: 74  76 69  Resp: 20   18  Temp: 97.9 F (36.6 C)   98 F (36.7 C)  TempSrc: Oral   Oral  SpO2: 99%   98%  Weight:      Height:        Intake/Output Summary (Last 24  hours) at 04/23/16 1125 Last data filed at 04/23/16 0954  Gross per 24 hour  Intake              728 ml  Output             3950 ml  Net            -3222 ml   Filed Weights   04/20/16 2113 04/21/16 2059 04/22/16 2112  Weight: (!) 166.9 kg (368 lb) (!) 167.2 kg (368 lb 9.8 oz) (!) 161.5 kg  (356 lb)    Examination:  General exam: Appears calm and comfortable  Respiratory system: Clear to auscultation. Respiratory effort normal. Cardiovascular system: S1 & S2 heard, RRR. No JVD, murmurs, rubs, gallops or clicks. 3+ pedal edema Gastrointestinal system: Abdomen is nondistended, soft and nontender. No organomegaly or masses felt. Normal bowel sounds heard. Central nervous system: Alert and oriented. No focal neurological deficits. Extremities: Symmetric 5 x 5 power. Skin: No rashes, lesions or ulcers Psychiatry: Judgement and insight appear normal. Mood & affect appropriate.     Data Reviewed: I have personally reviewed following labs and imaging studies  CBC:  Recent Labs Lab 04/19/16 1241 04/19/16 1835  WBC 8.6 8.2  HGB 11.9* 11.7*  HCT 37.2* 36.9*  MCV 82.5 82.9  PLT 163 330   Basic Metabolic Panel:  Recent Labs Lab 04/19/16 1241 04/19/16 1835 04/19/16 2012 04/21/16 0559 04/22/16 0437 04/23/16 0535  NA 142  --   --  141 141 142  K 5.3*  --   --  5.1 4.6 4.5  CL 117*  --   --  113* 114* 113*  CO2 19*  --   --  19* 19* 20*  GLUCOSE 132*  --   --  108* 103* 94  BUN 81*  --   --  75* 76* 74*  CREATININE 6.16* 6.24*  --  6.36* 6.37* 6.53*  CALCIUM 8.2*  --   --  8.4* 8.0* 8.3*  PHOS  --   --  4.6 4.8*  --   --    GFR: Estimated Creatinine Clearance: 20.6 mL/min (by C-G formula based on SCr of 6.53 mg/dL (H)). Liver Function Tests:  Recent Labs Lab 04/21/16 0559  ALBUMIN 3.3*   No results for input(s): LIPASE, AMYLASE in the last 168 hours. No results for input(s): AMMONIA in the last 168 hours. Coagulation Profile: No results for input(s): INR, PROTIME in the last 168 hours. Cardiac Enzymes:  Recent Labs Lab 04/19/16 2012 04/20/16 0114 04/20/16 0945  TROPONINI 0.04* 0.04* 0.03*   BNP (last 3 results) No results for input(s): PROBNP in the last 8760 hours. HbA1C: No results for input(s): HGBA1C in the last 72 hours. CBG: No results  for input(s): GLUCAP in the last 168 hours. Lipid Profile: No results for input(s): CHOL, HDL, LDLCALC, TRIG, CHOLHDL, LDLDIRECT in the last 72 hours. Thyroid Function Tests: No results for input(s): TSH, T4TOTAL, FREET4, T3FREE, THYROIDAB in the last 72 hours. Anemia Panel: No results for input(s): VITAMINB12, FOLATE, FERRITIN, TIBC, IRON, RETICCTPCT in the last 72 hours. Sepsis Labs: No results for input(s): PROCALCITON, LATICACIDVEN in the last 168 hours.  No results found for this or any previous visit (from the past 240 hour(s)).    Radiology Studies: No results found.   Scheduled Meds: . aspirin EC  81 mg Oral Daily  . calcitRIOL  0.25 mcg Oral Daily  . carvedilol  25 mg Oral BID WC  . furosemide  80  mg Intravenous Q8H  . heparin  5,000 Units Subcutaneous Q8H  . hydrALAZINE  100 mg Oral Q8H  . isosorbide mononitrate  60 mg Oral Daily  . pravastatin  40 mg Oral Daily  . sodium chloride flush  3 mL Intravenous Q12H   Continuous Infusions: None   LOS: 4 days    Time spent: [place], PA-S [facility] Pager [phone]  If 7PM-7AM, please contact night-coverage [email or web address] Password [email or web address] 04/23/2016, 11:25 AM   Birdie Hopes Pager: [phone] 04/23/2016, 12:38 PM

## 2016-04-23 NOTE — Progress Notes (Addendum)
[facility] Progress Note   Subjective:   No issues this morning. Limited conversation    Objective:   BP (!) 160/90   Pulse 76   Temp 97.9 F (36.6 C) (Oral)   Resp 20   Ht 6' (1.829 m)   Wt (!) 356 lb (161.5 kg)   SpO2 99%   BMI 48.28 kg/m   Intake/Output Summary (Last 24 hours) at 04/23/16 0856 Last data filed at 04/23/16 0600  Gross per 24 hour  Intake              731 ml  Output             3450 ml  Net            -2719 ml   Weight change: -12 lb 9.8 oz (-5.72 kg)  Physical Exam: KVQ:QVZDGLO up the chair, NAD  CVS:RRR, no murmurs noted  Resp: CTAB, no increased WOB, on room air Abd: BS+, no ttp  Ext: 3+ pitting edema to the knee, 1 + thigh edema  Imaging: No results found. Lab Results  Component Value Date   CREATININE 6.53 (H) 04/23/2016   CREATININE 6.37 (H) 04/22/2016   CREATININE 6.36 (H) 04/21/2016    Recent Labs Lab 04/19/16 1241 04/19/16 1835 04/19/16 2012 04/21/16 0559 04/22/16 0437 04/23/16 0535  NA 142  --   --  141 141 142  K 5.3*  --   --  5.1 4.6 4.5  CL 117*  --   --  113* 114* 113*  CO2 19*  --   --  19* 19* 20*  GLUCOSE 132*  --   --  108* 103* 94  BUN 81*  --   --  75* 76* 74*  CREATININE 6.16* 6.24*  --  6.36* 6.37* 6.53*  CALCIUM 8.2*  --   --  8.4* 8.0* 8.3*  PHOS  --   --  4.6 4.8*  --   --      Recent Labs Lab 04/19/16 1241 04/19/16 1835  WBC 8.6 8.2  HGB 11.9* 11.7*  HCT 37.2* 36.9*  MCV 82.5 82.9  PLT 163 158    Medications:    . aspirin EC  81 mg Oral Daily  . calcitRIOL  0.25 mcg Oral Daily  . carvedilol  25 mg Oral BID WC  . furosemide  80 mg Intravenous Q8H  . heparin  5,000 Units Subcutaneous Q8H  . hydrALAZINE  100 mg Oral Q8H  . isosorbide mononitrate  60 mg Oral Daily  . pravastatin  40 mg Oral Daily  . sodium chloride flush  3 mL Intravenous Q12H    Background: Edward Jordan is an 55 y.o. male with pmhx HTN, CKD, and HFrEF ( EF 35-40%) presenting for SOB and hypertensive emergency  likely with CHF exacerbation. In 2013/2014 was noted to have CKD4; now with worsening kidney function given non-compliance with antihypertensive medications over the past 6 months (unable to get uinsurance).   Assessment/ Plan:    1. CKD 5 w proteinuria likely due to hx of uncontrolled HTN, not currently followed by nephrology/ per patient did not realize he had CKD. Renal ultrasound with small cysts bilaterally, kidney size 11.5 cm/ 11.9 cm right and left respectively. Normal sized kidney's not concerning extracellular tissue deposition associated disease.SPEP wnl.  - SCr 6.36>6.37, stable elevated SCr, no changes over the last 24 hours.  - Volume status: Remains fluid overloaded on exam,  UOP 4.25 L  over the past 24  hours, Weight 370 lbs >>356 lbs,  Continue IV lasix 80 mg TID- consider converting to 160 PO Lasix TID   - Electrolytes: Remains stable  - Establish care with nephrologist, dialysis education video provided previously; patient remains uninterested in vein mapping/planning for AV fistula   2. Anemia: Hgb 11.7 (11/6) Iron 41 Saturation Ratio 18. s/p Ferraheme x 1 No ESA needed.  3. Metabolic Bone Disease: PTH  440 Phosphorus 4.8 - Will get vitamin D level  - Start daily calcitriol  4. HTN - "home medications metolazone 2.5 mg, hydralazine 50 mg daily, enalapril 20 mg, lopressor 100 mg". Has not taken these medications in 6 months.   -  Current blood pressure medications include IMDUR 60 mg, hydralazine100 mg TID, Coreg 25 mg BID - will increase coreg to 50 BID  5. HFrEF: ECHO (EF35-40% 2017), with  BNP 2,132. Receiving diuresis. Troponin slightly elevated. Cardiology consulted   Will sign off. Patient currently not interested pursuing vein mapping or planning during this hospitalization. Diuresis to the discretion of the primary team. Would recommend converting to 160 PO lasix BID.   Kerrin Mo, MD 04/23/2016, 8:56 AM   Creatinine "stable" in mid 6's. GFR about 10. Has  diuresed at least to point where SOB not an issue though has a lot of edema. Current lasix 80 IV TID so change to po 160 TID would be reasonable. Pt cannot or will not engage in dialysis planning including vein mapping or access planning despite GFR of 10 and I have painted worst case scenario for him. He will be scheduled for outpt appointment at [facility] and hope will choose to keep that appointment.  (Scheduled for 05/07/16 at [facility] with me, arrive at [facility] - bring all medicines). 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 With these summary recs, renal will sign off - please call if additional questions.  Manasvini Whatley B,MD 04/23/2016 1:27 PM

## 2016-04-24 LAB — BASIC METABOLIC PANEL
Anion gap: 11 (ref 5–15)
BUN: 76 mg/dL — AB (ref 6–20)
CALCIUM: 8.3 mg/dL — AB (ref 8.9–10.3)
CO2: 18 mmol/L — ABNORMAL LOW (ref 22–32)
CREATININE: 6.52 mg/dL — AB (ref 0.61–1.24)
Chloride: 113 mmol/L — ABNORMAL HIGH (ref 101–111)
GFR calc non Af Amer: 9 mL/min — ABNORMAL LOW (ref >60)
GFR, EST AFRICAN AMERICAN: 10 mL/min — AB (ref >60)
Glucose, Bld: 102 mg/dL — ABNORMAL HIGH (ref 65–99)
Potassium: 4.2 mmol/L (ref 3.5–5.1)
SODIUM: 142 mmol/L (ref 135–145)

## 2016-04-24 MED ORDER — CALCITRIOL 0.25 MCG PO CAPS: 0.2500 ug | ORAL_CAPSULE | Freq: Every day | ORAL | 0 refills | Status: AC

## 2016-04-24 MED ORDER — ASPIRIN 81 MG PO TBEC: 81.0000 mg | DELAYED_RELEASE_TABLET | Freq: Every day | ORAL | Status: AC

## 2016-04-24 MED ORDER — CARVEDILOL 25 MG PO TABS: 50.0000 mg | ORAL_TABLET | Freq: Two times a day (BID) | ORAL | 0 refills | Status: AC

## 2016-04-24 MED ORDER — ISOSORBIDE MONONITRATE ER 60 MG PO TB24: 60.0000 mg | ORAL_TABLET | Freq: Every day | ORAL | 0 refills | Status: AC

## 2016-04-24 MED ORDER — FUROSEMIDE 80 MG PO TABS: 160.0000 mg | ORAL_TABLET | Freq: Three times a day (TID) | ORAL | 0 refills | Status: DC

## 2016-04-24 MED ORDER — HYDRALAZINE HCL 100 MG PO TABS: 100.0000 mg | ORAL_TABLET | Freq: Three times a day (TID) | ORAL | 0 refills | Status: AC

## 2016-04-24 NOTE — Progress Notes (Signed)
Discharge instructions and medications discussed with patient.  Prescriptions given to patient.  All questions answered.  

## 2016-04-24 NOTE — Discharge Summary (Signed)
Physician Discharge Summary  [facility] FXT:[id number] DOB: 04/27/1961 DOA: 04/19/2016  PCP: No primary care provider on file.  Admit date: 04/19/2016 Discharge date: 04/24/2016  Admitted From: Home Disposition: Home  Recommendations for Outpatient Follow-up:  1. Follow up with Dr. Lorrene Jordan on 05/07/2016 at 9 AM 2. Please obtain BMP/CBC in one week  Home Health: NA Equipment/Devices:NA  Discharge Condition: Stable CODE STATUS: Full Code Diet recommendation: Diet renal with fluid restriction Fluid restriction. Filed Weights   04/21/16 2059 04/22/16 2112 04/24/16 1032  Weight: (!) 167.2 kg (368 lb 9.8 oz) (!) 161.5 kg (356 lb) (!) 157.7 kg (347 lb 10.7 oz)   Brief/Interim Summary: Patient is a 55 yo male with history of CHF (EF 35-40), CKD stage 5, HTN. [facility]ed [facility] with SOB, DOE, and increasing edema. Known medication noncompliance. Admitted for management of acute on chronic CHF and further evaluation and management of acute on chronic renal failure. Patient continues to decline interventions in preparation for hemodialysis, despite multiple recommendations from his medical team.  Discharge Diagnoses:  Principal Problem:   Acute systolic congestive heart failure ([place]) Active Problems:   Accelerated hypertension   CKD (chronic kidney disease) stage 3, GFR 30-59 ml/min   Morbid obesity (HCC)   Hyperlipidemia   Acute renal failure (ARF) (HCC)   Essential hypertension   Congestive heart failure (HCC)   Hyperkalemia   Acute on chronic systolic and diastolic CHF - 2992 EF 42-68% with known medication and diet noncompliance - 04/21/2016 EF 34-19%, grade 2 diastolic dysfunction - BNP 2132 Troponin 0.04-0.04-0.03 with negative chest pain or ischemic EKG - Diuresingwith IV Lasix. Although he has still has both 3 pedal edema is negative 10.7 L since admission. - Cardiology recommends outpatient follow-up with them or [facility] - Admitted  with weight of 168.2 kg (370 pounds), discharge weight is 157.7 kg (347 lbs and 10 Oz)  Acute on chronic renal failure - Stage 5at baseline. Creatinine 6.53,EGFR 10 - Likely due to poor perfusion in the setting on acute on chronic CHF vs progression of CKD due to hypertension - Renal ultrasound showed small cysts bilaterally. Normal size and not concerning for extracellular tissue deposition associated disease - Patient continues to decline the nephrology's recommendation to prepare for hemodialysis. - Discharge with improved volume status and repeated recommendation to prepare for HD. - Per Dr. Sanda Jordan recommendation 160 mg 3 times a day of Lasix and 0.25 mg of calcitriol daily.  Uncontrolled hypertension - BP 108/60 this am - Continuelasix, coreg, imdur, hydralazine. Avoid ACEI/ARB - Continue low sodium diet.   Mild hyperkalemia and mild metabolic acidosis - Likely due to renal failure - Potassium corrected to 4.5  Obesity - BMI 50.29 kg/m2 - Low calorie diet and exercise has been discussed with patient - OHS and OSA likely contributing factors to his SOB - Recommend outpatient sleep apnea assessment  Anemia - Consistent with anemia of chronic kidney disease - Hgb 11.7  Hyperlipidemia - Hold statins (till he can afford it) LDL is 79.   Discharge Instructions  Discharge Instructions    Diet - low sodium heart healthy    Complete by:  As directed    Increase activity slowly    Complete by:  As directed        Medication List    STOP taking these medications   cyclobenzaprine 10 MG tablet Commonly known as:  FLEXERIL   enalapril 20 MG tablet Commonly known as:  VASOTEC  ibuprofen 800 MG tablet Commonly known as:  ADVIL,MOTRIN   metolazone 2.5 MG tablet Commonly known as:  ZAROXOLYN   metoprolol 100 MG tablet Commonly known as:  LOPRESSOR   naproxen sodium 220 MG tablet Commonly known as:  ANAPROX   pravastatin 40 MG tablet Commonly known as:   PRAVACHOL     TAKE these medications   aspirin 81 MG EC tablet Take 1 tablet (81 mg total) by mouth daily.   calcitRIOL 0.25 MCG capsule Commonly known as:  ROCALTROL Take 1 capsule (0.25 mcg total) by mouth daily.   carvedilol 25 MG tablet Commonly known as:  COREG Take 2 tablets (50 mg total) by mouth 2 (two) times daily with a meal.   furosemide 80 MG tablet Commonly known as:  LASIX Take 2 tablets (160 mg total) by mouth 3 (three) times daily.   hydrALAZINE 100 MG tablet Commonly known as:  APRESOLINE Take 1 tablet (100 mg total) by mouth 3 (three) times daily. What changed:  medication strength  how much to take   isosorbide mononitrate 60 MG 24 hr tablet Commonly known as:  IMDUR Take 1 tablet (60 mg total) by mouth daily.   oxyCODONE-acetaminophen 5-325 MG tablet Commonly known as:  PERCOCET/ROXICET Take 2 tablets by mouth every 4 (four) hours as needed for pain.      Follow-up Information    Please use the resources provided to you in emergency room by case manager to assist you're your choice of doctor for follow up. Call on 04/20/2016.   Why:  A referral for you has been sent to [facility] if you have not received a call in 3 days you may contact them Call Sylvie Farrier at [place] [email or web address] Contact information: These [place] uninsured resources provide possible primary care providers, resources for discounted medications, housing, dental resources, affordable care act information, plus other resources for [name] Follow up.   Why:  Hospital followup appointment : Friday, April 30, 2016 at 9am. Please call and cancel this appointment if you are unable to keep the appointment. Phone number [phone]. Contact information: [place] [phone]       DUNHAM,CYNTHIA B, MD Follow up on 05/07/2016.    Specialty:  Nephrology Why:  Please arrive @ 9:00 AM, bring all medications with you Contact information: [place] [id number] [phone]          No Known Allergies  Consultations:   nephrology.  Cardiology   Procedures (Echo, Carotid, EGD, Colonoscopy, ERCP)  Echocardiogram done on 04/21/2016 Study Conclusions  - Left ventricle: The cavity size was normal. Wall thickness was   increased in a pattern of moderate LVH. There was mild concentric   hypertrophy. Systolic function was moderately reduced. The   estimated ejection fraction was in the range of 35% to 40%.   Diffuse hypokinesis. Features are consistent with a pseudonormal   left ventricular filling pattern, with concomitant abnormal   relaxation and increased filling pressure (grade 2 diastolic   dysfunction). Doppler parameters are consistent with   indeterminate ventricular filling pressure. - Aortic valve: Transvalvular velocity was within the normal range.   There was no stenosis. There was no regurgitation. - Mitral valve: Transvalvular velocity was within the normal range.   There was no evidence for stenosis. There was mild regurgitation. - Left atrium: The atrium  was severely dilated. - Right ventricle: The cavity size was normal. Wall thickness was   normal. Systolic function was mildly reduced. - Tricuspid valve: There was mild regurgitation. - Pulmonary arteries: Systolic pressure was within the normal   range. PA peak pressure: 34 mm Hg (S).  Radiological studies: Dg Chest 2 View  Result Date: 04/19/2016 CLINICAL DATA:  Shortness of breath. EXAM: CHEST  2 VIEW COMPARISON:  12/19/2012. FINDINGS: Cardiomegaly with pulmonary vascular prominence and bilateral interstitial prominence with small right pleural effusion. Findings consistent with congestive heart failure . No pneumothorax. No acute bony abnormality. IMPRESSION: Congestive heart failure with pulmonary interstitial edema  and small right pleural effusion. Electronically Signed   By: Marcello Moores  Register   On: 04/19/2016 12:25   US Renal  Result Date: 04/20/2016 CLINICAL DATA:  Chronic renal failure EXAM: RENAL / URINARY TRACT ULTRASOUND COMPLETE COMPARISON:  CT abdomen and pelvis Oct 29, 2012 FINDINGS: Right Kidney: Length: 11.5 cm. Echogenicity is increased. The renal cortical thickness is within normal limits. No perinephric fluid or hydronephrosis visualized. There is a a cyst arising from the upper pole the right kidney measuring 1.4 x 0.8 x 1.7 cm. A cyst more inferiorly measures 2.4 x 0.8 x 1.4 cm. No sonographically demonstrable calculus or ureterectasis. Left Kidney: Length: 11.9 cm. Echogenicity is increased. Renal cortical thickness is normal. There is a cyst in the mid left kidney measuring 0.9 x 1.1 x 1.2 cm. No perinephric fluid or hydronephrosis visualized. No sonographically demonstrable calculus or ureterectasis. Bladder: Appears normal for degree of bladder distention. There is a right pleural effusion. IMPRESSION: Kidneys are echogenic consistent with medical renal disease. No obstructing foci identified in either kidney. There are small renal cysts bilaterally. There is a right pleural effusion. Electronically Signed   By: Lowella Grip III M.D.   On: 04/20/2016 09:26     Subjective:  Discharge Exam: Vitals:   04/23/16 1650 04/23/16 2133 04/24/16 0505 04/24/16 0928  BP: 125/66 111/67 121/77 (!) 153/90  Pulse: 70 66 69 77  Resp: _0 Temp: 98 F (36.7 C) 98.4 F (36.9 C) 97.6 F (36.4 C) 97.8 F (36.6 C)  TempSrc: Oral Axillary Oral Oral  SpO2: 98% 100% 100% 98%  Weight:      Height:       General: Pt is alert, awake, not in acute distress Cardiovascular: RRR, S1/S2 +, no rubs, no gallops Respiratory: CTA bilaterally, no wheezing, no rhonchi Abdominal: Soft, NT, ND, bowel sounds + Extremities: no edema, no cyanosis   The results of significant diagnostics from this  hospitalization (including imaging, microbiology, ancillary and laboratory) are listed below for reference.    Microbiology: No results found for this or any previous visit (from the past 240 hour(s)).   Labs: BNP (last 3 results)  Recent Labs  04/19/16 1241  BNP 0,626.9*   Basic Metabolic Panel:  Recent Labs Lab 04/19/16 1241 04/19/16 1835 04/19/16 2012 04/21/16 0559 04/22/16 0437 04/23/16 0535 04/24/16 0546  NA 142  --   --  141 141 142 142  K 5.3*  --   --  5.1 4.6 4.5 4.2  CL 117*  --   --  113* 114* 113* 113*  CO2 19*  --   --  19* 19* 20* 18*  GLUCOSE 132*  --   --  108* 103* 94 102*  BUN 81*  --   --  75* 76* 74* 76*  CREATININE 6.16* 6.24*  --  6.36* 6.37*  6.53* 6.52*  CALCIUM 8.2*  --   --  8.4* 8.0* 8.3* 8.3*  PHOS  --   --  4.6 4.8*  --   --   --    Liver Function Tests:  Recent Labs Lab 04/21/16 0559  ALBUMIN 3.3*   No results for input(s): LIPASE, AMYLASE in the last 168 hours. No results for input(s): AMMONIA in the last 168 hours. CBC:  Recent Labs Lab 04/19/16 1241 04/19/16 1835  WBC 8.6 8.2  HGB 11.9* 11.7*  HCT 37.2* 36.9*  MCV 82.5 82.9  PLT 163 158   Cardiac Enzymes:  Recent Labs Lab 04/19/16 2012 04/20/16 0114 04/20/16 0945  TROPONINI 0.04* 0.04* 0.03*   BNP: Invalid input(s): POCBNP CBG: No results for input(s): GLUCAP in the last 168 hours. D-Dimer No results for input(s): DDIMER in the last 72 hours. Hgb A1c No results for input(s): HGBA1C in the last 72 hours. Lipid Profile No results for input(s): CHOL, HDL, LDLCALC, TRIG, CHOLHDL, LDLDIRECT in the last 72 hours. Thyroid function studies No results for input(s): TSH, T4TOTAL, T3FREE, THYROIDAB in the last 72 hours.  Invalid input(s): FREET3 Anemia work up No results for input(s): VITAMINB12, FOLATE, FERRITIN, TIBC, IRON, RETICCTPCT in the last 72 hours. Urinalysis    Component Value Date/Time   COLORURINE STRAW (A) 04/19/2016 0113   APPEARANCEUR CLEAR  04/19/2016 0113   LABSPEC 1.007 04/19/2016 0113   PHURINE 5.0 04/19/2016 0113   GLUCOSEU NEGATIVE 04/19/2016 0113   HGBUR NEGATIVE 04/19/2016 0113   BILIRUBINUR NEGATIVE 04/19/2016 0113   KETONESUR NEGATIVE 04/19/2016 0113   PROTEINUR 30 (A) 04/19/2016 0113   UROBILINOGEN 0.2 10/29/2012 1601   NITRITE NEGATIVE 04/19/2016 0113   LEUKOCYTESUR NEGATIVE 04/19/2016 0113   Sepsis Labs Invalid input(s): PROCALCITONIN,  WBC,  LACTICIDVEN Microbiology No results found for this or any previous visit (from the past 240 hour(s)).   Time coordinating discharge: Over 30 minutes  SIGNED:   Birdie Hopes, MD  [facility] 04/24/2016, 10:17 AM Pager   If 7PM-7AM, please contact night-coverage [email or web address] Password [email or web address]

## 2016-04-30 ENCOUNTER — Ambulatory Visit: Payer: Self-pay | Admitting: Family Medicine

## 2016-05-31 ENCOUNTER — Ambulatory Visit: Payer: Self-pay | Admitting: Family Medicine

## 2016-12-30 ENCOUNTER — Inpatient Hospital Stay (HOSPITAL_COMMUNITY)
Admission: EM | Admit: 2016-12-30 | Discharge: 2017-01-10 | DRG: 683 | Disposition: A | Discharge status: Home / Self Care | Payer: Self-pay | Attending: Family Medicine | Admitting: Family Medicine

## 2016-12-30 ENCOUNTER — Encounter (HOSPITAL_COMMUNITY): Payer: Self-pay | Admitting: Emergency Medicine

## 2016-12-30 DIAGNOSIS — I5022 Chronic systolic (congestive) heart failure: Secondary | ICD-10-CM | POA: Diagnosis present

## 2016-12-30 DIAGNOSIS — E46 Unspecified protein-calorie malnutrition: Secondary | ICD-10-CM | POA: Diagnosis present

## 2016-12-30 DIAGNOSIS — Z992 Dependence on renal dialysis: Secondary | ICD-10-CM

## 2016-12-30 DIAGNOSIS — Z95828 Presence of other vascular implants and grafts: Secondary | ICD-10-CM

## 2016-12-30 DIAGNOSIS — N186 End stage renal disease: Secondary | ICD-10-CM | POA: Diagnosis present

## 2016-12-30 DIAGNOSIS — L909 Atrophic disorder of skin, unspecified: Secondary | ICD-10-CM

## 2016-12-30 DIAGNOSIS — Z7982 Long term (current) use of aspirin: Secondary | ICD-10-CM

## 2016-12-30 DIAGNOSIS — N179 Acute kidney failure, unspecified: Principal | ICD-10-CM | POA: Diagnosis present

## 2016-12-30 DIAGNOSIS — R238 Other skin changes: Secondary | ICD-10-CM | POA: Diagnosis present

## 2016-12-30 DIAGNOSIS — R7989 Other specified abnormal findings of blood chemistry: Secondary | ICD-10-CM | POA: Diagnosis present

## 2016-12-30 DIAGNOSIS — R531 Weakness: Secondary | ICD-10-CM

## 2016-12-30 DIAGNOSIS — E870 Hyperosmolality and hypernatremia: Secondary | ICD-10-CM | POA: Diagnosis present

## 2016-12-30 DIAGNOSIS — R739 Hyperglycemia, unspecified: Secondary | ICD-10-CM | POA: Diagnosis present

## 2016-12-30 DIAGNOSIS — I132 Hypertensive heart and chronic kidney disease with heart failure and with stage 5 chronic kidney disease, or end stage renal disease: Secondary | ICD-10-CM | POA: Diagnosis present

## 2016-12-30 DIAGNOSIS — E861 Hypovolemia: Secondary | ICD-10-CM | POA: Diagnosis present

## 2016-12-30 DIAGNOSIS — E785 Hyperlipidemia, unspecified: Secondary | ICD-10-CM | POA: Diagnosis present

## 2016-12-30 DIAGNOSIS — Z419 Encounter for procedure for purposes other than remedying health state, unspecified: Secondary | ICD-10-CM

## 2016-12-30 DIAGNOSIS — E860 Dehydration: Secondary | ICD-10-CM | POA: Diagnosis present

## 2016-12-30 DIAGNOSIS — Z6841 Body Mass Index (BMI) 40.0 and over, adult: Secondary | ICD-10-CM

## 2016-12-30 LAB — COMPREHENSIVE METABOLIC PANEL
ALT: 12 U/L — ABNORMAL LOW (ref 17–63)
AST: 14 U/L — ABNORMAL LOW (ref 15–41)
Albumin: 3.3 g/dL — ABNORMAL LOW (ref 3.5–5.0)
Alkaline Phosphatase: 57 U/L (ref 38–126)
Anion gap: 20 — ABNORMAL HIGH (ref 5–15)
BILIRUBIN TOTAL: 0.9 mg/dL (ref 0.3–1.2)
BUN: 199 mg/dL — ABNORMAL HIGH (ref 6–20)
CHLORIDE: 126 mmol/L — AB (ref 101–111)
CO2: 11 mmol/L — ABNORMAL LOW (ref 22–32)
Calcium: 8.3 mg/dL — ABNORMAL LOW (ref 8.9–10.3)
Creatinine, Ser: 13.24 mg/dL — ABNORMAL HIGH (ref 0.61–1.24)
GFR, EST AFRICAN AMERICAN: 4 mL/min — AB (ref >60)
GFR, EST NON AFRICAN AMERICAN: 4 mL/min — AB (ref >60)
Glucose, Bld: 139 mg/dL — ABNORMAL HIGH (ref 65–99)
POTASSIUM: 5.1 mmol/L (ref 3.5–5.1)
Sodium: 157 mmol/L — ABNORMAL HIGH (ref 135–145)
TOTAL PROTEIN: 7.9 g/dL (ref 6.5–8.1)

## 2016-12-30 LAB — CBC WITH DIFFERENTIAL/PLATELET
Basophils Absolute: 0 10*3/uL (ref 0.0–0.1)
Basophils Relative: 0 %
EOS PCT: 0 %
Eosinophils Absolute: 0 10*3/uL (ref 0.0–0.7)
HEMATOCRIT: 35.7 % — AB (ref 39.0–52.0)
Hemoglobin: 11.9 g/dL — ABNORMAL LOW (ref 13.0–17.0)
LYMPHS ABS: 1 10*3/uL (ref 0.7–4.0)
LYMPHS PCT: 5 %
MCH: 27.2 pg (ref 26.0–34.0)
MCHC: 33.3 g/dL (ref 30.0–36.0)
MCV: 81.7 fL (ref 78.0–100.0)
MONO ABS: 0.6 10*3/uL (ref 0.1–1.0)
MONOS PCT: 3 %
Neutro Abs: 17.5 10*3/uL — ABNORMAL HIGH (ref 1.7–7.7)
Neutrophils Relative %: 92 %
PLATELETS: 157 10*3/uL (ref 150–400)
RBC: 4.37 MIL/uL (ref 4.22–5.81)
RDW: 16.7 % — AB (ref 11.5–15.5)
WBC: 19.1 10*3/uL — ABNORMAL HIGH (ref 4.0–10.5)

## 2016-12-30 LAB — CK: CK TOTAL: 131 U/L (ref 49–397)

## 2016-12-30 MED ORDER — SODIUM CHLORIDE 0.9 % IV BOLUS (SEPSIS)
500.0000 mL | Freq: Once | INTRAVENOUS | Status: AC
  Administered 2016-12-30: 500 mL via INTRAVENOUS

## 2016-12-30 MED ORDER — SODIUM CHLORIDE 0.45 % IV SOLN: INTRAVENOUS | Status: DC

## 2016-12-30 MED ORDER — HYDRALAZINE HCL 50 MG PO TABS
100.0000 mg | ORAL_TABLET | Freq: Three times a day (TID) | ORAL | Status: DC
  Administered 2016-12-31 – 2017-01-09 (×24): 100 mg via ORAL
  Filled 2016-12-30 (×29): qty 2

## 2016-12-30 MED ORDER — ISOSORBIDE MONONITRATE ER 60 MG PO TB24
60.0000 mg | ORAL_TABLET | Freq: Every day | ORAL | Status: DC
  Administered 2016-12-31 – 2017-01-10 (×10): 60 mg via ORAL
  Filled 2016-12-30 (×10): qty 1

## 2016-12-30 MED ORDER — SODIUM CHLORIDE 0.9 % IV BOLUS (SEPSIS): 500.0000 mL | Freq: Once | INTRAVENOUS | Status: DC

## 2016-12-30 MED ORDER — CARVEDILOL 25 MG PO TABS
50.0000 mg | ORAL_TABLET | Freq: Two times a day (BID) | ORAL | Status: DC
  Administered 2016-12-31 – 2017-01-10 (×19): 50 mg via ORAL
  Filled 2016-12-30 (×21): qty 2

## 2016-12-30 NOTE — ED Notes (Signed)
Pt given ginger ale and graham crackers per Oleta Mouse, MD

## 2016-12-30 NOTE — ED Triage Notes (Signed)
Pt arrives with [facility] for fall that occurred on Saturday (7/14) when bed broke and he rolled out; pt states he was not strong enough to get himself out of the floor, thought he would eventually get better; friends/family attempted to check on patient as they had not heard from him in a while, could not get into the house, called 911 and forced entry, found patient lying in floor covered in urine and feces with excoriation burns to groin

## 2016-12-30 NOTE — H&P (Signed)
[facility] Admission History and Physical Service Pager: [phone]  Patient name: [facility] Medical record number: [id number] Date of birth: [birth year] Age: 56 y.o. Gender: male  Primary Care Provider: No primary care provider on file. Consultants: none Code Status: full  Chief Complaint: "dehydration"  Assessment and Plan: Ziquan Fidel is a 56 y.o. male presenting with 5 day history of poor po intake. Patient choosing to be limited historian. Apparently bed broke 5 days ago, and patient was unable to get off of floor for unknown reasons. Did not take any meds and had almost no po intake. Found covered in urine and feces. Some skin breakdown of perineum and medial thighs at arrival. Brought in with na 157, k 5.1, bun 199, creatinine 12.24. Htn up to 220s, hr 97, no temp recorded. Wbc 19.1.  PMH is significant for htn, ckd, hld, morbid obesity, HFrEF   Dehydration Patient with no po intake in setting of known ckd. HR up to high 90s. Sodium 157, cr 13.24, bun 199. Still making urine at home, and has been able to make small amount upon arrival to ed. Received 1/2L NS bolus in ed. Very clear that patient will need IV hydration. Na reflect chronic severe hypernatremia likely from hypovolemic state. Cr is significantly elevated along with BUN, however pt continues to make urine and without signs of uremic encephalopathy or pericarditis. Potassium WNL. Will consider nephrology consult in am. No emergent need for dialysis at this time, and patient has refused long-term dialysis in the past. He is tolerating some po. Has history of HFrEF 35% per chart review in 2017 so will need to monitor fluid status very carefully. - Admit to inpatient, Dr. Erin Hearing - D5 1/2 NS @ 227mL/hr, will adjust fluids/rate based on bmp with goal of drop no more than 10 mEq over next 24 hrs - renal/heart healthy diet - Monitor volume status closely - Daily weights, Strict I/Os - daily bmp - Will  consider nephrology consult in am, no emergent dialysis indicated  Hypernatremia  Hyperuremia Patient with sodium up to 157 on admission. Has had little to no po intake for 5 days. Will plan to slowly decrease sodium. Starting on d5 1/2 ns @ 271mL for now. Will plan to change fluid/rate depending on daily bmp.  - Plan per above  Hypertension Has not been taking home meds for last 4-5 days. bp 197/123 at last check. Receiving home coreg, hydralazine, imdur in the ed. Will check vitals per floor. As with most of the rest of this patient's problems could probably benefit from dialysis. Will consider additional treatment if home meds fail to lower pressure. Will hold home lasix as patient is in acute renal failure 2/2 dehydration. - vitals per floor routine - continue coreg 50mg  bid - continue hydralazine 100mg  tid - continue imdur 60mg  daily  HFrEF Echo from 2017 showing EF 35-40%. Will need to follow up ekg ordered in ed though K lvl WNL. Monitor for any s/s of fluid overload given necessity for iv hydration 2/2 dehydration. If develops acute shortness of breath or ekg with concerning changes, will possibly need re-evaluation.  - continue coreg - f/u ekg - monitor cardiac status  Skin Breakdown Patient soiled in own urine and feces for 4 days. Areas of irritation in perineum and medial thighs. Tender to touch but area clean and dry on inspection. Will need to follow closely, no concern at this time for violation of dermis. No signs of Fournier gangrene on exam. -  Consider wound care consult  Morbid obesity Patient apparently unable to get up after bed broke for 4-5 days. Will have pt/ot assess function status while here. Pt likely has sedentary lifestyle but concerning due to pts limited hx of social and living situation. - F/U PT/OT recommendations - Could consider getting CSW involved  Hyperlipidemia Listed in problem list. Lipid pane from 04/2016 grossly normal aside from low hdl.  Doesn't take any medication. Will likely need to follow this up as an outpatient.  Hyperglycemia Patient with elevated glucose upon admission of 140. A1c in November 5.6. Will redraw a1c. - F/U A1c - F/U blood glucose on daily bmp  FEN/GI: diabetic/renal/heart healthy Prophylaxis: heparin SQ  Disposition: likely home pending PT/OT evaluation  History of Present Illness:  [facility] is a 56 y.o. male with pmh of htn, ckd, hld, morbid obesity, HFrEF presents after a 5 day history of poor po intake. Patient mostly  uncooperative during interview, most of history gleaned from conversation with ed physician and chart review. Per report the patient's bed broke 4-5 days ago and the patient said that he sat on the floor following this time. He was apparently unable to get up and move around his house during this time period. He said he felt "a chill in his body" which prevented him from getting up. When asked to expand on this he said "you will understand when you are a lot older." When asked why he didn't call for help from his roommates he said that "he was a very private person and didn't want them in his business." Apparently the patient did not take any of his medications or take anything by mouth over this time period. He eventually talked to his friend on the phone, and his friend called ems to the patient's house. He was found soiled on his own urine and feces by ems.  Upon arrival to the emergency department initial work up included cmp, cb, ua, ck. He was found to have a Na 157, potassium 5.1, cl 126, co2 11, bun 199, cr 13.24. His wbc 19.1, hgb 11.9. UA was found to have rare bacteria, protein, and moderate blood. CK 131. Vitals were HR 97, bp 215/117, weight 315lb. He was given 1/2L of NS bolus in ed. Home blood pressure meds of coreg, hydralazine, imdur restared in ed. Patient found to have some moderate skin breakdown between thighs and perineum.  Patient has a known history of ckd. He was  admitted in November of 2017, and was evaluated for dialysis access at that time. The patient refused medical advice at that time, and did not want to entertain thought of dialysis at that time. His creatinine was between 6-7 at that admission. Appears to be at his baseline mental status. Is known to have very poor compliance and refusing medical treatment. AOx3 in ed.   Review Of Systems: Per HPI with the following additions:  Limited ROS performed due to patient not wanting to answer questions  Review of Systems  Constitutional: Negative for chills and fever.  Respiratory: Negative for cough.   Cardiovascular: Negative for chest pain.  Gastrointestinal: Negative for constipation and diarrhea.  Genitourinary: Negative for dysuria.  Skin: Positive for rash.    Patient Active Problem List   Diagnosis Date Noted  . Congestive heart failure ([place])   . Hyperkalemia   . Essential hypertension   . Acute renal failure (ARF) ([place]) 04/19/2016  . Acute systolic congestive heart failure ([place]) 04/19/2016  . Acute on  chronic systolic CHF (congestive heart failure) ([place]) 07/02/2011  . Accelerated hypertension 07/01/2011  . CKD (chronic kidney disease) stage 3, GFR 30-59 ml/min 07/01/2011  . Morbid obesity ([place]) 07/01/2011  . Hyperglycemia 07/01/2011  . Elevated CK 07/01/2011  . Hyperlipidemia 07/01/2011    Past Medical History: Past Medical History:  Diagnosis Date  . Hypertension     Past Surgical History: Past Surgical History:  Procedure Laterality Date  . NO PAST SURGERIES      Social History: Social History  Substance Use Topics  . Smoking status: Never Smoker  . Smokeless tobacco: Never Used  . Alcohol use Yes     Comment: ocassionally   Additional social history:   Please also refer to relevant sections of EMR.  Family History: Refused to answer  Allergies and Medications: No Known Allergies No current facility-administered medications on file prior to encounter.     Current Outpatient Prescriptions on File Prior to Encounter  Medication Sig Dispense Refill  . carvedilol (COREG) 25 MG tablet Take 2 tablets (50 mg total) by mouth 2 (two) times daily with a meal. 120 tablet 0  . furosemide (LASIX) 80 MG tablet Take 2 tablets (160 mg total) by mouth 3 (three) times daily. 180 tablet 0  . hydrALAZINE (APRESOLINE) 100 MG tablet Take 1 tablet (100 mg total) by mouth 3 (three) times daily. 90 tablet 0  . isosorbide mononitrate (IMDUR) 60 MG 24 hr tablet Take 1 tablet (60 mg total) by mouth daily. 30 tablet 0  . aspirin EC 81 MG EC tablet Take 1 tablet (81 mg total) by mouth daily. (Patient not taking: Reported on 12/30/2016)    . calcitRIOL (ROCALTROL) 0.25 MCG capsule Take 1 capsule (0.25 mcg total) by mouth daily. (Patient not taking: Reported on 12/30/2016) 30 capsule 0  . oxyCODONE-acetaminophen (PERCOCET/ROXICET) 5-325 MG per tablet Take 2 tablets by mouth every 4 (four) hours as needed for pain. (Patient not taking: Reported on 04/19/2016) 15 tablet 0    Objective: BP (!) 197/123   Pulse (!) 110   Resp 18   Ht 6' (1.829 m)   Wt (!) 315 lb (142.9 kg)   SpO2 97%   BMI 42.72 kg/m  Exam: General: no acute distress, obese, aox4 Eyes: eomi, perrl ENTM: no signs of external trauma nose, bilateral ears Neck: supple, no adenopathy Cardiovascular: rrr, no m/r/g Respiratory: Clear to auscultation bilaterally, no chest pain Abdomen: soft, non-tender, non-distended MSK: able to move all four extremities Skin: areas of breakdown perineum, bilateral medial thighs. No concern for violation of dermis. Neuro: aox4, non-cooperative, no deficits appreciated, cn 2-12 intact Psych: non-cooperative  Labs and Imaging: CBC BMET   Recent Labs Lab 12/30/16 2145  WBC 19.1*  HGB 11.9*  HCT 35.7*  PLT 157    Recent Labs Lab 12/30/16 2145  NA 157*  K 5.1  CL 126*  CO2 11*  BUN 199*  CREATININE 13.24*  GLUCOSE 139*  CALCIUM 8.3*      Guadalupe Dawn  MD 12/30/2016, 11:42 PM PGY-1, [place] [facility] pager: [phone], text pages welcome  Upper Level Addendum:  I have seen and evaluated this patient along with Dr. Kris Mouton and reviewed the above note, making necessary revisions in blue.  Harriet [place], [place], PGY-2

## 2016-12-30 NOTE — ED Provider Notes (Signed)
[facility] DEPT Provider Note   CSN: [id number] Arrival date & time: 12/30/16  2116     History   Chief Complaint Chief Complaint  Patient presents with  . Fall  . Groin Burn    HPI Edward Jordan is a 56 y.o. male.  HPI 56 year old male who presents with fall and inability to get up. He states that on Saturday, 5 days ago on his bed broke from underneath him. States that he rolled out of the bed onto the floor and did not fall but was unable to get up because his legs felt too stiff. He thought he eventually would be able to get better and get up himself, which is why he did not call for help. Per EMS, friends and family came to check in on patient as they had not heard from him, they were unable to get into the house. 911 was called and there was forced injury. He was found on the floor covered in urine and feces. He states that he has otherwise been in his usual state of health. Denies any fevers, cough, difficulty breathing, chest pain, vomiting or diarrhea, abdominal pain, focal numbness or weakness  He has a history of hypertension, chronic kidney disease, chronic systolic CHF with EF of 81%.   Past Medical History:  Diagnosis Date  . Hypertension     Patient Active Problem List   Diagnosis Date Noted  . Congestive heart failure ([place])   . Hyperkalemia   . Essential hypertension   . Acute renal failure (ARF) ([place]) 04/19/2016  . Acute systolic congestive heart failure ([place]) 04/19/2016  . Acute on chronic systolic CHF (congestive heart failure) ([place]) 07/02/2011  . Accelerated hypertension 07/01/2011  . CKD (chronic kidney disease) stage 3, GFR 30-59 ml/min 07/01/2011  . Morbid obesity ([place]) 07/01/2011  . Hyperglycemia 07/01/2011  . Elevated CK 07/01/2011  . Hyperlipidemia 07/01/2011    Past Surgical History:  Procedure Laterality Date  . NO PAST SURGERIES         Home Medications    Prior to Admission medications   Medication Sig Start Date End Date Taking?  Authorizing Provider  carvedilol (COREG) 25 MG tablet Take 2 tablets (50 mg total) by mouth 2 (two) times daily with a meal. 04/24/16  Yes Elmahi, Rae Lips, MD  furosemide (LASIX) 80 MG tablet Take 2 tablets (160 mg total) by mouth 3 (three) times daily. 04/24/16  Yes Verlee Monte, MD  hydrALAZINE (APRESOLINE) 100 MG tablet Take 1 tablet (100 mg total) by mouth 3 (three) times daily. 04/24/16  Yes Verlee Monte, MD  isosorbide mononitrate (IMDUR) 60 MG 24 hr tablet Take 1 tablet (60 mg total) by mouth daily. 04/24/16  Yes Verlee Monte, MD  aspirin EC 81 MG EC tablet Take 1 tablet (81 mg total) by mouth daily. Patient not taking: Reported on 12/30/2016 04/24/16   Verlee Monte, MD  calcitRIOL (ROCALTROL) 0.25 MCG capsule Take 1 capsule (0.25 mcg total) by mouth daily. Patient not taking: Reported on 12/30/2016 04/24/16   Verlee Monte, MD  oxyCODONE-acetaminophen (PERCOCET/ROXICET) 5-325 MG per tablet Take 2 tablets by mouth every 4 (four) hours as needed for pain. Patient not taking: Reported on 04/19/2016 10/29/12   Ezequiel Essex, MD    Family History No family history on file.  Social History Social History  Substance Use Topics  . Smoking status: Never Smoker  . Smokeless tobacco: Never Used  . Alcohol use Yes     Comment: ocassionally     Allergies  Patient has no known allergies.   Review of Systems Review of Systems  Constitutional: Positive for fever.  Respiratory: Negative for cough and shortness of breath.   Cardiovascular: Negative for chest pain.  Gastrointestinal: Negative for abdominal pain.  Genitourinary: Negative for dysuria and frequency.  Hematological: Does not bruise/bleed easily.  Psychiatric/Behavioral: Negative for confusion.  All other systems reviewed and are negative.    Physical Exam Updated Vital Signs BP (!) 197/123   Pulse (!) 110   Resp 18   Ht 6' (1.829 m)   Wt (!) 142.9 kg (315 lb)   SpO2 97%   BMI 42.72 kg/m   Physical  Exam Physical Exam  Nursing note and vitals reviewed. Constitutional: Well developed, well nourished, non-toxic, and in no acute distress Head: Normocephalic and atraumatic.  Mouth/Throat: Oropharynx is clear and moist.  Neck: Normal range of motion. Neck supple.  Cardiovascular: Tachycardic rate and regular rhythm.   Pulmonary/Chest: Effort normal and breath sounds normal.  Abdominal: Soft. Morbidly obese There is no tenderness. There is no rebound and no guarding.  Gu: Pressure wounds/excoriations noted to bilateral groin and scrotum.  Musculoskeletal: No deformities  Neurological: Alert, no facial droop, fluent speech, moves all extremities symmetrically Skin: Skin is warm and dry.  Psychiatric: Cooperative   ED Treatments / Results  Labs (all labs ordered are listed, but only abnormal results are displayed) Labs Reviewed  CBC WITH DIFFERENTIAL/PLATELET - Abnormal; Notable for the following:       Result Value   WBC 19.1 (*)    Hemoglobin 11.9 (*)    HCT 35.7 (*)    RDW 16.7 (*)    Neutro Abs 17.5 (*)    All other components within normal limits  COMPREHENSIVE METABOLIC PANEL - Abnormal; Notable for the following:    Sodium 157 (*)    Chloride 126 (*)    CO2 11 (*)    Glucose, Bld 139 (*)    BUN 199 (*)    Creatinine, Ser 13.24 (*)    Calcium 8.3 (*)    Albumin 3.3 (*)    AST 14 (*)    ALT 12 (*)    GFR calc non Af Amer 4 (*)    GFR calc Af Amer 4 (*)    Anion gap 20 (*)    All other components within normal limits  CK  URINALYSIS, ROUTINE W REFLEX MICROSCOPIC    EKG  EKG Interpretation None       Radiology No results found.  Procedures Procedures (including critical care time) CRITICAL CARE Performed by: Forde Dandy   Total critical care time: 32 minutes  Critical care time was exclusive of separately billable procedures and treating other patients.  Critical care was necessary to treat or prevent imminent or life-threatening  deterioration.  Critical care was time spent personally by me on the following activities: development of treatment plan with patient and/or surrogate as well as nursing, discussions with consultants, evaluation of patient's response to treatment, examination of patient, obtaining history from patient or surrogate, ordering and performing treatments and interventions, ordering and review of laboratory studies, ordering and review of radiographic studies, pulse oximetry and re-evaluation of patient's condition.  Medications Ordered in ED Medications  carvedilol (COREG) tablet 50 mg (not administered)  hydrALAZINE (APRESOLINE) tablet 100 mg (not administered)  isosorbide mononitrate (IMDUR) 24 hr tablet 60 mg (not administered)  sodium chloride 0.9 % bolus 500 mL (500 mLs Intravenous New Bag/Given 12/30/16 2203)  Initial Impression / Assessment and Plan / ED Course  I have reviewed the triage vital signs and the nursing notes.  Pertinent labs & imaging results that were available during my care of the patient were reviewed by me and considered in my medical decision making (see chart for details).     56 year old male who presents after being unable to get up from the ground. He is mentating normally. Nontoxic in no acute distress. Hypertensive, likely due to not taking his blood pressure medications over the past 5 days. He is tachycardic, and dry on exam. Excoriations and mild pressure injuries to that scrotum and bilateral groin.  Blood work notable for acute renal failure with a creatinine of 13. He states that he has been urinating this time. Suspect it is likely due to inability to eat or drink over the past 5 days that he has been on the ground. Also very hypernatremic likely due to dehydration. Initially did receive 500 mL bolus of normal saline. Will start on half normal for hypernatremia.   Discussed with family medicine teaching service, who will admit for ongoing  management.  Final Clinical Impressions(s) / ED Diagnoses   Final diagnoses:  Hypernatremia  Acute renal failure, unspecified acute renal failure type [facility])    New Prescriptions New Prescriptions   No medications on file     Forde Dandy, MD 12/30/16 2345

## 2016-12-31 DIAGNOSIS — N179 Acute kidney failure, unspecified: Secondary | ICD-10-CM | POA: Diagnosis present

## 2016-12-31 LAB — BASIC METABOLIC PANEL
Anion gap: 17 — ABNORMAL HIGH (ref 5–15)
Anion gap: 15 (ref 5–15)
Anion gap: 14 (ref 5–15)
BUN: 190 mg/dL — AB (ref 6–20)
BUN: 180 mg/dL — AB (ref 6–20)
BUN: 187 mg/dL — AB (ref 6–20)
CALCIUM: 7.4 mg/dL — AB (ref 8.9–10.3)
CO2: 12 mmol/L — ABNORMAL LOW (ref 22–32)
CO2: 12 mmol/L — ABNORMAL LOW (ref 22–32)
CO2: 13 mmol/L — ABNORMAL LOW (ref 22–32)
CREATININE: 12.42 mg/dL — AB (ref 0.61–1.24)
CREATININE: 11.78 mg/dL — AB (ref 0.61–1.24)
CREATININE: 11.61 mg/dL — AB (ref 0.61–1.24)
Calcium: 7.8 mg/dL — ABNORMAL LOW (ref 8.9–10.3)
Calcium: 7.4 mg/dL — ABNORMAL LOW (ref 8.9–10.3)
Chloride: 120 mmol/L — ABNORMAL HIGH (ref 101–111)
Chloride: 115 mmol/L — ABNORMAL HIGH (ref 101–111)
Chloride: 113 mmol/L — ABNORMAL HIGH (ref 101–111)
GFR calc Af Amer: 5 mL/min — ABNORMAL LOW (ref >60)
GFR calc Af Amer: 5 mL/min — ABNORMAL LOW (ref >60)
GFR calc Af Amer: 5 mL/min — ABNORMAL LOW (ref >60)
GFR, EST NON AFRICAN AMERICAN: 4 mL/min — AB (ref >60)
GFR, EST NON AFRICAN AMERICAN: 4 mL/min — AB (ref >60)
GFR, EST NON AFRICAN AMERICAN: 4 mL/min — AB (ref >60)
Glucose, Bld: 175 mg/dL — ABNORMAL HIGH (ref 65–99)
Glucose, Bld: 178 mg/dL — ABNORMAL HIGH (ref 65–99)
Glucose, Bld: 144 mg/dL — ABNORMAL HIGH (ref 65–99)
POTASSIUM: 4.6 mmol/L (ref 3.5–5.1)
Potassium: 4.5 mmol/L (ref 3.5–5.1)
Potassium: 4.5 mmol/L (ref 3.5–5.1)
SODIUM: 149 mmol/L — AB (ref 135–145)
SODIUM: 142 mmol/L (ref 135–145)
SODIUM: 140 mmol/L (ref 135–145)

## 2016-12-31 LAB — CBC
HCT: 32.5 % — ABNORMAL LOW (ref 39.0–52.0)
Hemoglobin: 10.9 g/dL — ABNORMAL LOW (ref 13.0–17.0)
MCH: 27.3 pg (ref 26.0–34.0)
MCHC: 33.5 g/dL (ref 30.0–36.0)
MCV: 81.3 fL (ref 78.0–100.0)
PLATELETS: 133 10*3/uL — AB (ref 150–400)
RBC: 4 MIL/uL — AB (ref 4.22–5.81)
RDW: 16.6 % — ABNORMAL HIGH (ref 11.5–15.5)
WBC: 18.2 10*3/uL — ABNORMAL HIGH (ref 4.0–10.5)

## 2016-12-31 LAB — URINALYSIS, ROUTINE W REFLEX MICROSCOPIC
BILIRUBIN URINE: NEGATIVE
Glucose, UA: NEGATIVE mg/dL
KETONES UR: NEGATIVE mg/dL
LEUKOCYTES UA: NEGATIVE
NITRITE: NEGATIVE
Protein, ur: 100 mg/dL — AB
SPECIFIC GRAVITY, URINE: 1.014 (ref 1.005–1.030)
pH: 5 (ref 5.0–8.0)

## 2016-12-31 LAB — HIV ANTIBODY (ROUTINE TESTING W REFLEX): HIV Screen 4th Generation wRfx: NONREACTIVE

## 2016-12-31 MED ORDER — ONDANSETRON HCL 4 MG PO TABS
4.0000 mg | ORAL_TABLET | Freq: Four times a day (QID) | ORAL | Status: DC | PRN
Start: 2016-12-31 — End: 2017-01-05
  Administered 2017-01-03: 4 mg via ORAL
  Filled 2016-12-31: qty 1

## 2016-12-31 MED ORDER — DEXTROSE-NACL 5-0.45 % IV SOLN
INTRAVENOUS | Status: DC
  Administered 2016-12-31 (×2): via INTRAVENOUS

## 2016-12-31 MED ORDER — ACETAMINOPHEN 650 MG RE SUPP: 650.0000 mg | Freq: Four times a day (QID) | RECTAL | Status: DC | PRN

## 2016-12-31 MED ORDER — ACETAMINOPHEN 325 MG PO TABS
650.0000 mg | ORAL_TABLET | Freq: Four times a day (QID) | ORAL | Status: DC | PRN
  Administered 2017-01-03: 650 mg via ORAL
  Filled 2016-12-31: qty 2

## 2016-12-31 MED ORDER — DEXTROSE-NACL 5-0.45 % IV SOLN: INTRAVENOUS | Status: DC

## 2016-12-31 MED ORDER — HEPARIN SODIUM (PORCINE) 5000 UNIT/ML IJ SOLN
5000.0000 [IU] | Freq: Three times a day (TID) | INTRAMUSCULAR | Status: DC
  Administered 2016-12-31 – 2017-01-10 (×29): 5000 [IU] via SUBCUTANEOUS
  Filled 2016-12-31 (×30): qty 1

## 2016-12-31 MED ORDER — ONDANSETRON HCL 4 MG/2ML IJ SOLN
4.0000 mg | Freq: Four times a day (QID) | INTRAMUSCULAR | Status: DC | PRN
  Administered 2017-01-04 – 2017-01-05 (×2): 4 mg via INTRAVENOUS
  Filled 2016-12-31 (×2): qty 2

## 2016-12-31 MED ORDER — ASPIRIN EC 81 MG PO TBEC
81.0000 mg | DELAYED_RELEASE_TABLET | Freq: Every day | ORAL | Status: DC
  Administered 2016-12-31 – 2017-01-10 (×10): 81 mg via ORAL
  Filled 2016-12-31 (×11): qty 1

## 2016-12-31 MED ORDER — CALCITRIOL 0.25 MCG PO CAPS
0.2500 ug | ORAL_CAPSULE | Freq: Every day | ORAL | Status: DC
  Administered 2016-12-31 – 2017-01-10 (×10): 0.25 ug via ORAL
  Filled 2016-12-31 (×9): qty 1

## 2016-12-31 NOTE — Progress Notes (Signed)
PT Cancellation Note  Patient Details Name: Edward Jordan MRN: [id number] DOB: 09-28-1960   Cancelled Treatment:    Reason Eval/Treat Not Completed: Patient not medically ready.  Pt vagal'd down when getting up to ?BSC.  Low BP.  Will try to see pt tomorrow as able. 12/31/2016  Edward Jordan, PT [phone]  (pager)   Edward Jordan 12/31/2016, 4:31 PM

## 2016-12-31 NOTE — Progress Notes (Signed)
Family Medicine Teaching Service Daily Progress Note Intern Pager: [phone]  Patient name: Edward Jordan Medical record number: [id number] Date of birth: 1961/06/07 Age: 56 y.o. Gender: male  Primary Care Provider: Patient, No Pcp Per Consultants: Nephrology  Code Status: Full   Pt Overview and Major Events to Date:  Edward Jordan is a 56 y.o. Male presenting with hypernatremia, dehydration, and AKI 2/2 prolonged immobility for 5 days. Patient was admitted to [facility] on 12/31/2016.  Assessment and Plan: Edward Jordan is a 56 y.o. male presenting with 5 day history of poor po intake. On admission found to be hypernatremia, dehydrated, and AKI. PMH is significant for htn, ckd, hld, morbid obesity, HFrEF   Dehydration Patient with no po intake in setting of known ckd. On admission HR up to high 90s. Sodium 157, cr 13.24, bun 199. Still making urine at home, and has been able to make small amount upon arrival to ed. Received 1/2L NS bolus in ed. Very clear that patient will need IV hydration. Na reflect chronic severe hypernatremia likely from hypovolemic state. Cr is significantly elevated along with BUN, however pt continues to make urine and without signs of uremic encephalopathy or pericarditis. Potassium WNL. No emergent need for dialysis at this time, and patient has refused long-term dialysis in the past. He is tolerating some po. Has history of HFrEF 35% per chart review in 2017 so will need to monitor fluid status very carefully. Current Na of 149, K of 4.6, Cl of 120. BUN of 190. Cr of 7.8.  - decreased D5 1/2 NS to 1110mL/hr, will adjust fluids/rate based on bmp with goal of drop no more than 10 mEq over next 24 hrs -repeat BMP at 12:00 - renal/heart healthy diet - Monitor volume status closely - Daily weights, Strict I/Os - daily bmp - Will consider nephrology consult in am, no emergent dialysis indicated  Hypernatremia  Hyperuremia Patient with sodium up to 157 on admission, improved to 149 with  rehydration. Current BUN of 190. Has had little to no po intake for 5 days. Will plan to slowly decrease sodium. Will plan to change fluid/rate depending on daily bmp.  - monitor fluid status - adjust fluid rate as needed, currently at a rate of 100 mL/hr - have consulted nephrology   Hypertension Has not been taking home meds for last 4-5 days. Current BP of 159/90. Receiving home coreg, hydralazine, imdur in the ed. Will check vitals per floor. As with most of the rest of this patient's problems could probably benefit from dialysis. Will consider additional treatment if home meds fail to lower pressure. Will hold home lasix as patient is in acute renal failure 2/2 dehydration. - vitals per floor routine - continue coreg 50mg  bid - continue hydralazine 100mg  tid - continue imdur 60mg  daily  HFrEF Echo from 2017 showing EF 35-40%. Will need to monitor EKG. Monitor for any s/s of fluid overload given necessity for iv hydration 2/2 dehydration. If develops acute shortness of breath or ekg with concerning changes, will possibly need re-evaluation.  - continue coreg - f/u ekg - monitor cardiac status  Skin Breakdown Patient soiled in own urine and feces for 4 days. Areas of irritation in perineum and medial thighs. Tender to touch but area clean and dry on inspection. Will need to follow closely, no concern at this time for violation of dermis. No signs of Fournier gangrene on exam.   Morbid obesity Patient apparently unable to get up after bed broke for 4-5  days. Will have pt/ot assess function status while here. Pt likely has sedentary lifestyle but concerning due to pts limited hx of social and living situation. - F/U PT/OT recommendations - Could consider getting CSW involved  Hyperlipidemia Listed in problem list. Lipid panel from 04/2016 grossly normal aside from low hdl. Doesn't take any medication. Will likely need to follow this up as an outpatient.  Hyperglycemia Patient  with elevated glucose upon admission of 140. Current glucose 175. A1c in November 5.6. Will redraw a1c. - F/U A1c - F/U blood glucose on daily bmp  FEN/GI: heart healthy, carb modified  PPx: SCDs, heparin   Disposition: continue to monitor   Subjective:  Patient today states she is better than yesterday. Patient was uncooperative on exam. Would not answer questions of why patient was down for 5 days, but states was coherent at the time. States house was cold which was why he did not get up. States that "you will understand when you are 56 years old". Patient is still making urine, but has refused dialysis in the past. Patient denied pain, SOB, and CP.   Objective: Temp:  [98 F (36.7 C)] 98 F (36.7 C) (07/20 0245) Pulse Rate:  [88-110] 90 (07/20 0245) Resp:  [10-26] 16 (07/20 0245) BP: (157-215)/(88-123) 159/90 (07/20 0820) SpO2:  [97 %-100 %] 100 % (07/20 0245) FiO2 (%):  [0 %] 0 % (07/20 0232) Weight:  [282 lb 6.6 oz (128.1 kg)-315 lb (142.9 kg)] 282 lb 6.6 oz (128.1 kg) (07/20 0245) Physical Exam: General: awake and alert, sitting in bed eating breakfast  Cardiovascular: RRR, no MRG Respiratory: CTAB Abdomen: soft, non tender, bowel sounds x4 quadrants  Extremities: no edema, no pain to palpation  Skin: area of breakdown on perineum, was uncooperative on exam so unsure of tenderness of irritation   Laboratory:  Recent Labs Lab 12/30/16 2145 12/31/16 0340  WBC 19.1* 18.2*  HGB 11.9* 10.9*  HCT 35.7* 32.5*  PLT 157 133*    Recent Labs Lab 12/30/16 2145 12/31/16 0340  NA 157* 149*  K 5.1 4.6  CL 126* 120*  CO2 11* 12*  BUN 199* 190*  CREATININE 13.24* 12.42*  CALCIUM 8.3* 7.8*  PROT 7.9  --   BILITOT 0.9  --   ALKPHOS 57  --   ALT 12*  --   AST 14*  --   GLUCOSE 139* 175*    Imaging/Diagnostic Tests:   Caroline More, DO 12/31/2016, 11:37 AM PGY-1, [address] Intern pager: [phone], text pages welcome

## 2016-12-31 NOTE — Consult Note (Signed)
Edward Jordan Edward Jordan Requesting Physician:  Dr. Yisroel Ramming  Reason for Consult:  Acute on Chronic Renal Dysfunction  HPI:  Patient is a 56 yo M with a pmhx of HTN, HLD, HFrEF, and CKD (bl Scr ~7.0) presenting after being found down for an extended period of time. History was limited due to patient being unwilling to answer questions. He reports his bed broke on Saturday (7/15). He fell but was too weak to get up off the floor due to being "too cold". He did not take any of his medicines, eat any food, or drink any water during this time period. Yesterday (7/19), he eventually texted a friend for help however, on arrival, friend found patient covered in urine and feces and called 911. Patient denies pain. He denies suicidal ideation. ROS otherwise negative.   PMH Incudes:  HTN  HLD  HFrEF (35-40%)  CKD V   Creatinine, Ser (mg/dL)  Date Value  12/31/2016 12.42 (H)  12/30/2016 13.24 (H)  04/24/2016 6.52 (H)  04/23/2016 6.53 (H)  04/22/2016 6.37 (H)  04/21/2016 6.36 (H)  04/19/2016 6.24 (H)  04/19/2016 6.16 (H)  10/29/2012 3.00 (H)  07/02/2011 2.61 (H)  ] I/Os:  ROS NSAIDS: None IV Contrast: None  TMP/SMX: None  Hypotension: None Balance of 12 systems is negative w/ exceptions as above  PMH  Past Medical History:  Diagnosis Date  . Hypertension    PSH  Past Surgical History:  Procedure Laterality Date  . NO PAST SURGERIES     FH No family history on file. SH  reports that he has never smoked. He has never used smokeless tobacco. He reports that he drinks alcohol. He reports that he does not use drugs. Allergies No Known Allergies Home medications Prior to Admission medications   Medication Sig Start Date End Date Taking? Authorizing Provider  carvedilol (COREG) 25 MG tablet Take 2 tablets (50 mg total) by mouth 2 (two) times daily with a meal. 04/24/16  Yes Elmahi, Rae Lips, MD  furosemide (LASIX) 80 MG tablet Take 2 tablets  (160 mg total) by mouth 3 (three) times daily. 04/24/16  Yes Verlee Monte, MD  hydrALAZINE (APRESOLINE) 100 MG tablet Take 1 tablet (100 mg total) by mouth 3 (three) times daily. 04/24/16  Yes Verlee Monte, MD  isosorbide mononitrate (IMDUR) 60 MG 24 hr tablet Take 1 tablet (60 mg total) by mouth daily. 04/24/16  Yes Verlee Monte, MD  aspirin EC 81 MG EC tablet Take 1 tablet (81 mg total) by mouth daily. Patient not taking: Reported on 12/30/2016 04/24/16   Verlee Monte, MD  calcitRIOL (ROCALTROL) 0.25 MCG capsule Take 1 capsule (0.25 mcg total) by mouth daily. Patient not taking: Reported on 12/30/2016 04/24/16   Verlee Monte, MD  oxyCODONE-acetaminophen (PERCOCET/ROXICET) 5-325 MG per tablet Take 2 tablets by mouth every 4 (four) hours as needed for pain. Patient not taking: Reported on 04/19/2016 10/29/12   Ezequiel Essex, MD    Current Medications Scheduled Meds: . aspirin EC  81 mg Oral Daily  . calcitRIOL  0.25 mcg Oral Daily  . carvedilol  50 mg Oral BID WC  . heparin  5,000 Units Subcutaneous Q8H  . hydrALAZINE  100 mg Oral TID  . isosorbide mononitrate  60 mg Oral Daily   Continuous Infusions: . dextrose 5 % and 0.45% NaCl     PRN Meds:.acetaminophen **OR** acetaminophen, ondansetron **OR** ondansetron (ZOFRAN) IV  CBC  Recent Labs Lab 12/30/16 2145 12/31/16 0340  WBC 19.1*  18.2*  NEUTROABS 17.5*  --   HGB 11.9* 10.9*  HCT 35.7* 32.5*  MCV 81.7 81.3  PLT 157 654*   Basic Metabolic Panel  Recent Labs Lab 12/30/16 2145 12/31/16 0340  NA 157* 149*  K 5.1 4.6  CL 126* 120*  CO2 11* 12*  GLUCOSE 139* 175*  BUN 199* 190*  CREATININE 13.24* 12.42*  CALCIUM 8.3* 7.8*    Physical Exam  Blood pressure (!) 159/90, pulse 90, temperature 98 F (36.7 C), temperature source Oral, resp. rate 16, height 6' (1.829 m), weight 282 lb 6.6 oz (128.1 kg), SpO2 100 %. GEN: Obese, NAD ENT: Dry mucus membranes CV: RRR PULM: Poor inspiratory effort and limited due to  habitus but CTAB ABD: Soft, non tender, normal BS EXT: No edema, no asterixis    Assessment  1. Acute on Chronic Renal Dysfunction: Secondary to no PO intake or medications x 5 days. Unclear baseline but likely CKD V with bl Scr ~ 7.0 (last labs in system 04/2016). He was assessed by Dr. Lorrene Reid during a prior hospitalization but never followed up outpatient. He continues to have good UOP, potassium WNL and hypernatremia now resolved with IVFs. Unclear mental status baseline, would argue patient may be exhibiting mild sxs of uremia however difficult to assess mental status as patient is largely uncooperative with exam. No asterixis appreciated. Patient remains adamant that he does not wish to proceed with dialysis. He refuses to elaborate on his reasoning or concerns. Patient is aware that without dialysis he will die. He has also declined hospice referral. He denies SI, however, history is concerning given that patient remained down for 5 days and did not call for help seeing as he had access to a phone.   2. Hypernatremia: Secondary to dehydration. Now resolved with D5 1/2 saline.  3. HTN: In the setting of CKD V  Plan 1. Patient is uninterested in HD at this time. He is also declining hospice referral. Unclear at this point if patient has capacity, would recommend psychiatry evaluation for further assessment. Will sign off, re-consult if patient becomes open to dialysis, however, likely a poor candidate given other comorbidities and poor social support. Continue to monitor daily labs, unclear what new baseline will be. Strict I/Os, daily weights. Avoid nephrotoxic meds.  2. Continue rehydration with IVFs 3. Continue current regimen. Hold lasix.  4. Other issues per primary.   Edward Jordan, M.D. - PGY2 Pager: [phone] Jordan, 2:11 PM

## 2017-01-01 DIAGNOSIS — N183 Chronic kidney disease, stage 3 (moderate): Secondary | ICD-10-CM

## 2017-01-01 DIAGNOSIS — E870 Hyperosmolality and hypernatremia: Secondary | ICD-10-CM

## 2017-01-01 DIAGNOSIS — F4320 Adjustment disorder, unspecified: Secondary | ICD-10-CM

## 2017-01-01 DIAGNOSIS — N179 Acute kidney failure, unspecified: Principal | ICD-10-CM

## 2017-01-01 LAB — BASIC METABOLIC PANEL
ANION GAP: 16 — AB (ref 5–15)
BUN: 195 mg/dL — AB (ref 6–20)
CHLORIDE: 112 mmol/L — AB (ref 101–111)
CO2: 11 mmol/L — AB (ref 22–32)
Calcium: 7.4 mg/dL — ABNORMAL LOW (ref 8.9–10.3)
Creatinine, Ser: 11.87 mg/dL — ABNORMAL HIGH (ref 0.61–1.24)
GFR calc Af Amer: 5 mL/min — ABNORMAL LOW (ref >60)
GFR, EST NON AFRICAN AMERICAN: 4 mL/min — AB (ref >60)
GLUCOSE: 106 mg/dL — AB (ref 65–99)
POTASSIUM: 4.6 mmol/L (ref 3.5–5.1)
Sodium: 139 mmol/L (ref 135–145)

## 2017-01-01 LAB — CBC
HEMATOCRIT: 26.5 % — AB (ref 39.0–52.0)
HEMOGLOBIN: 8.9 g/dL — AB (ref 13.0–17.0)
MCH: 27 pg (ref 26.0–34.0)
MCHC: 33.6 g/dL (ref 30.0–36.0)
MCV: 80.3 fL (ref 78.0–100.0)
Platelets: 132 10*3/uL — ABNORMAL LOW (ref 150–400)
RBC: 3.3 MIL/uL — ABNORMAL LOW (ref 4.22–5.81)
RDW: 16.5 % — AB (ref 11.5–15.5)
WBC: 11.6 10*3/uL — ABNORMAL HIGH (ref 4.0–10.5)

## 2017-01-01 LAB — HEMOGLOBIN A1C
HEMOGLOBIN A1C: 5.3 % (ref 4.8–5.6)
Mean Plasma Glucose: 105 mg/dL

## 2017-01-01 NOTE — Consult Note (Signed)
[facility] Face-to-Face Psychiatry Consult   Reason for Consult:  Capacity evaluation Referring Physician:  Dr. Erin Hearing Patient Identification: Edward Jordan MRN:  [id number] Principal Diagnosis: <principal problem not specified> Diagnosis:   Patient Active Problem List   Diagnosis Date Noted  . Hypernatremia [E87.0]   . Acute renal failure ([place]) [N17.9] 12/31/2016  . Congestive heart failure ([place]) [I50.9]   . Hyperkalemia [E87.5]   . Essential hypertension [I10]   . Acute renal failure (ARF) ([place]) [N17.9] 04/19/2016  . Acute systolic congestive heart failure ([place]) [I50.21] 04/19/2016  . Acute on chronic systolic CHF (congestive heart failure) ([place]) [I50.23] 07/02/2011  . Accelerated hypertension [I10] 07/01/2011  . CKD (chronic kidney disease), stage III [N18.3] 07/01/2011  . Morbid obesity ([place]) [E66.01] 07/01/2011  . Hyperglycemia [R73.9] 07/01/2011  . Elevated CK [R74.8] 07/01/2011  . Hyperlipidemia [E78.5] 07/01/2011    Total Time spent with patient: 1 hour  Subjective:   Edward Jordan is a 56 y.o. male patient admitted with fall and unable to get up from the floor.  HPI:  Edward Jordan is a 56 y.o. male with pmh of htn, ckd, hld, morbid obesity, HFrEF presents after a 5 day history of poor po intake. Patient seen, chart reviewed for this face-to-face psychiatric consultation and evaluation of capacity to make his own medical decisions. Patient appeared sitting in a chair next to the bed. Patient is moderately cooperative with my interview and repeatedly stated that the physician notes knows his medical problems, so he does not want to spell it out even explained that we are trying to understand about his understanding about his medical problems. Patient reportedly stated that I'm not crazy and I do not need to see a psychiatrist. Patient reportedly lives by himself and he has a 48 years old daughter in [place]. 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 Patient understands she had renal impairment and he was recommended hemodialysis but it was not able to make his decision at this time and stated he is waiting for more time to decide. Patient stated he he did not say no to the treatment. Patient stated he does not know more in and outs about the being on dialysis and time commitment required for the hemodialysis.  Past Psychiatric History: Patient denies history of mental illness. Patient no history of inpatient or outpatient psychiatric services  Risk to Self: Is patient at risk for suicide?: No Risk to Others:   Prior Inpatient Therapy:   Prior Outpatient Therapy:    Past Medical History:  Past Medical History:  Diagnosis Date  . Hypertension     Past Surgical History:  Procedure Laterality Date  . NO PAST SURGERIES     Family History: No family history on file. Family Psychiatric  History: Patient denied family history of mental illness. Social History:  History  Alcohol Use  . Yes    Comment: ocassionally     History  Drug Use No    Social History   Social History  . Marital status: Legally Separated    Spouse name: N/A  . Number of children: N/A  . Years of education: N/A   Social History Main Topics  . Smoking status: Never Smoker  . Smokeless tobacco: Never Used  . Alcohol use Yes     Comment: ocassionally  . Drug use: No  . Sexual activity: No   Other Topics Concern  . None   Social  History Narrative  . None   Additional Social History:    Allergies:  No Known Allergies  Labs:  Results for orders placed or performed during the hospital encounter of 12/30/16 (from the past 48 hour(s))  CBC with Differential     Status: Abnormal   Collection Time: 12/30/16  9:45 PM  Result Value Ref Range   WBC 19.1 (H) 4.0 - 10.5 K/uL   RBC 4.37 4.22 - 5.81 MIL/uL   Hemoglobin 11.9 (L) 13.0 - 17.0 g/dL   HCT 35.7 (L) 39.0 - 52.0 %   MCV 81.7 78.0 - 100.0 fL   MCH 27.2 26.0 -  34.0 pg   MCHC 33.3 30.0 - 36.0 g/dL   RDW 16.7 (H) 11.5 - 15.5 %   Platelets 157 150 - 400 K/uL   Neutrophils Relative % 92 %   Neutro Abs 17.5 (H) 1.7 - 7.7 K/uL   Lymphocytes Relative 5 %   Lymphs Abs 1.0 0.7 - 4.0 K/uL   Monocytes Relative 3 %   Monocytes Absolute 0.6 0.1 - 1.0 K/uL   Eosinophils Relative 0 %   Eosinophils Absolute 0.0 0.0 - 0.7 K/uL   Basophils Relative 0 %   Basophils Absolute 0.0 0.0 - 0.1 K/uL  Comprehensive metabolic panel     Status: Abnormal   Collection Time: 12/30/16  9:45 PM  Result Value Ref Range   Sodium 157 (H) 135 - 145 mmol/L   Potassium 5.1 3.5 - 5.1 mmol/L   Chloride 126 (H) 101 - 111 mmol/L   CO2 11 (L) 22 - 32 mmol/L   Glucose, Bld 139 (H) 65 - 99 mg/dL   BUN 199 (H) 6 - 20 mg/dL   Creatinine, Ser 13.24 (H) 0.61 - 1.24 mg/dL   Calcium 8.3 (L) 8.9 - 10.3 mg/dL   Total Protein 7.9 6.5 - 8.1 g/dL   Albumin 3.3 (L) 3.5 - 5.0 g/dL   AST 14 (L) 15 - 41 U/L   ALT 12 (L) 17 - 63 U/L   Alkaline Phosphatase 57 38 - 126 U/L   Total Bilirubin 0.9 0.3 - 1.2 mg/dL   GFR calc non Af Amer 4 (L) >60 mL/min   GFR calc Af Amer 4 (L) >60 mL/min    Comment: (NOTE) The eGFR has been calculated using the CKD EPI equation. This calculation has not been validated in all clinical situations. eGFR's persistently <60 mL/min signify possible Chronic Kidney Disease.    Anion gap 20 (H) 5 - 15  CK     Status: None   Collection Time: 12/30/16  9:45 PM  Result Value Ref Range   Total CK 131 49 - 397 U/L  HIV antibody (Routine Testing)     Status: None   Collection Time: 12/31/16  3:40 AM  Result Value Ref Range   HIV Screen 4th Generation wRfx Non Reactive Non Reactive    Comment: (NOTE) Performed At: [facility] [place], [place] [id number] Lindon Romp MD [id number]   CBC     Status: Abnormal   Collection Time: 12/31/16  3:40 AM  Result Value Ref Range   WBC 18.2 (H) 4.0 - 10.5 K/uL   RBC 4.00 (L) 4.22 - 5.81 MIL/uL    Hemoglobin 10.9 (L) 13.0 - 17.0 g/dL   HCT 32.5 (L) 39.0 - 52.0 %   MCV 81.3 78.0 - 100.0 fL   MCH 27.3 26.0 - 34.0 pg   MCHC 33.5 30.0 - 36.0 g/dL  RDW 16.6 (H) 11.5 - 15.5 %   Platelets 133 (L) 150 - 400 K/uL  Basic metabolic panel     Status: Abnormal   Collection Time: 12/31/16  3:40 AM  Result Value Ref Range   Sodium 149 (H) 135 - 145 mmol/L    Comment: DELTA CHECK NOTED   Potassium 4.6 3.5 - 5.1 mmol/L   Chloride 120 (H) 101 - 111 mmol/L   CO2 12 (L) 22 - 32 mmol/L   Glucose, Bld 175 (H) 65 - 99 mg/dL   BUN 190 (H) 6 - 20 mg/dL   Creatinine, Ser 12.42 (H) 0.61 - 1.24 mg/dL   Calcium 7.8 (L) 8.9 - 10.3 mg/dL   GFR calc non Af Amer 4 (L) >60 mL/min   GFR calc Af Amer 5 (L) >60 mL/min    Comment: (NOTE) The eGFR has been calculated using the CKD EPI equation. This calculation has not been validated in all clinical situations. eGFR's persistently <60 mL/min signify possible Chronic Kidney Disease.    Anion gap 17 (H) 5 - 15  Hemoglobin A1c     Status: None   Collection Time: 12/31/16  3:40 AM  Result Value Ref Range   Hgb A1c MFr Bld 5.3 4.8 - 5.6 %    Comment: (NOTE)         Pre-diabetes: 5.7 - 6.4         Diabetes: >6.4         Glycemic control for adults with diabetes: <7.0    Mean Plasma Glucose 105 mg/dL    Comment: (NOTE) Performed At: [facility] [place], [place] [id number] Lindon Romp MD [id number]   Basic metabolic panel     Status: Abnormal   Collection Time: 12/31/16 11:45 AM  Result Value Ref Range   Sodium 142 135 - 145 mmol/L   Potassium 4.5 3.5 - 5.1 mmol/L   Chloride 115 (H) 101 - 111 mmol/L   CO2 12 (L) 22 - 32 mmol/L   Glucose, Bld 178 (H) 65 - 99 mg/dL   BUN 180 (H) 6 - 20 mg/dL   Creatinine, Ser 11.78 (H) 0.61 - 1.24 mg/dL   Calcium 7.4 (L) 8.9 - 10.3 mg/dL   GFR calc non Af Amer 4 (L) >60 mL/min   GFR calc Af Amer 5 (L) >60 mL/min    Comment: (NOTE) The eGFR has been calculated using the CKD EPI  equation. This calculation has not been validated in all clinical situations. eGFR's persistently <60 mL/min signify possible Chronic Kidney Disease.    Anion gap 15 5 - 15  Basic metabolic panel     Status: Abnormal   Collection Time: 12/31/16  7:53 PM  Result Value Ref Range   Sodium 140 135 - 145 mmol/L   Potassium 4.5 3.5 - 5.1 mmol/L   Chloride 113 (H) 101 - 111 mmol/L   CO2 13 (L) 22 - 32 mmol/L   Glucose, Bld 144 (H) 65 - 99 mg/dL   BUN 187 (H) 6 - 20 mg/dL   Creatinine, Ser 11.61 (H) 0.61 - 1.24 mg/dL   Calcium 7.4 (L) 8.9 - 10.3 mg/dL   GFR calc non Af Amer 4 (L) >60 mL/min   GFR calc Af Amer 5 (L) >60 mL/min    Comment: (NOTE) The eGFR has been calculated using the CKD EPI equation. This calculation has not been validated in all clinical situations. eGFR's persistently <60 mL/min signify possible Chronic Kidney Disease.  Anion gap 14 5 - 15  Basic metabolic panel     Status: Abnormal   Collection Time: 01/01/17  6:52 AM  Result Value Ref Range   Sodium 139 135 - 145 mmol/L   Potassium 4.6 3.5 - 5.1 mmol/L   Chloride 112 (H) 101 - 111 mmol/L   CO2 11 (L) 22 - 32 mmol/L   Glucose, Bld 106 (H) 65 - 99 mg/dL   BUN 195 (H) 6 - 20 mg/dL   Creatinine, Ser 11.87 (H) 0.61 - 1.24 mg/dL   Calcium 7.4 (L) 8.9 - 10.3 mg/dL   GFR calc non Af Amer 4 (L) >60 mL/min   GFR calc Af Amer 5 (L) >60 mL/min    Comment: (NOTE) The eGFR has been calculated using the CKD EPI equation. This calculation has not been validated in all clinical situations. eGFR's persistently <60 mL/min signify possible Chronic Kidney Disease.    Anion gap 16 (H) 5 - 15  CBC     Status: Abnormal   Collection Time: 01/01/17  6:52 AM  Result Value Ref Range   WBC 11.6 (H) 4.0 - 10.5 K/uL   RBC 3.30 (L) 4.22 - 5.81 MIL/uL   Hemoglobin 8.9 (L) 13.0 - 17.0 g/dL   HCT 26.5 (L) 39.0 - 52.0 %   MCV 80.3 78.0 - 100.0 fL   MCH 27.0 26.0 - 34.0 pg   MCHC 33.6 30.0 - 36.0 g/dL   RDW 16.5 (H) 11.5 - 15.5 %    Platelets 132 (L) 150 - 400 K/uL    Current Facility-Administered Medications  Medication Dose Route Frequency Provider Last Rate Last Dose  . acetaminophen (TYLENOL) tablet 650 mg  650 mg Oral Q6H PRN Guadalupe Dawn, MD       Or  . acetaminophen (TYLENOL) suppository 650 mg  650 mg Rectal Q6H PRN Guadalupe Dawn, MD      . aspirin EC tablet 81 mg  81 mg Oral Daily Guadalupe Dawn, MD   81 mg at 01/01/17 0951  . calcitRIOL (ROCALTROL) capsule 0.25 mcg  0.25 mcg Oral Daily Guadalupe Dawn, MD   0.25 mcg at 01/01/17 0949  . carvedilol (COREG) tablet 50 mg  50 mg Oral BID WC Forde Dandy, MD   50 mg at 01/01/17 0951  . heparin injection 5,000 Units  5,000 Units Subcutaneous Q8H Guadalupe Dawn, MD   5,000 Units at 01/01/17 0544  . hydrALAZINE (APRESOLINE) tablet 100 mg  100 mg Oral TID Forde Dandy, MD   100 mg at 01/01/17 0951  . isosorbide mononitrate (IMDUR) 24 hr tablet 60 mg  60 mg Oral Daily Forde Dandy, MD   60 mg at 01/01/17 3716  . ondansetron (ZOFRAN) tablet 4 mg  4 mg Oral Q6H PRN Guadalupe Dawn, MD       Or  . ondansetron [facility]) injection 4 mg  4 mg Intravenous Q6H PRN Guadalupe Dawn, MD        Musculoskeletal: Strength & Muscle Tone: decreased Gait & Station: unable to stand Patient leans: N/A  Psychiatric Specialty Exam: Physical Exam as per history and physical   ROS patient denied nausea, vomiting, abdomen pain, shortness of breath and chest pain.  No Fever-chills, No Headache, No changes with Vision or hearing, reports vertigo No problems swallowing food or Liquids, No Chest pain, Cough or Shortness of Breath, No Abdominal pain, No Nausea or Vommitting, Bowel movements are regular, No Blood in stool or Urine, No dysuria, No new skin rashes  or bruises, No new joints pains-aches,  No new weakness, tingling, numbness in any extremity, No recent weight gain or loss, No polyuria, polydypsia or polyphagia,   A full 10 point Review of Systems was done, except  as stated above, all other Review of Systems were negative.  Blood pressure 117/64, pulse 63, temperature 98.2 F (36.8 C), temperature source Oral, resp. rate 18, height 6' (1.829 m), weight (!) 136.7 kg (301 lb 5.9 oz), SpO2 100 %.Body mass index is 40.87 kg/m.  General Appearance: Guarded, morbidly overweight   Eye Contact:  Good  Speech:  Clear and Coherent  Volume:  Normal  Mood:  Irritable  Affect:  Appropriate and Congruent  Thought Process:  Coherent and Goal Directed  Orientation:  Full (Time, Place, and Person)  Thought Content:  WDL  Suicidal Thoughts:  No  Homicidal Thoughts:  No  Memory:  Immediate;   Good Recent;   Fair Remote;   Fair  Judgement:  Impaired  Insight:  Fair  Psychomotor Activity:  Decreased  Concentration:  Concentration: Fair and Attention Span: Fair  Recall:  [employer] of Knowledge:  Good  Language:  Good  Akathisia:  Negative  Handed:  Right  AIMS (if indicated):     Assets:  Communication Skills Desire for Improvement Housing Leisure Time Resilience Social Support  ADL's:  Impaired  Cognition:  WNL  Sleep:        Treatment Plan Summary: 56 years old male with morbid obesity presented with recent fall and acute renal injury and required hemodialysis not able to make up his mind. Patient understand if he does not get the treatment he might die. Patient does not want to be to discuss about why he does not want to proceed for the hemodialysis except saying it is time commitment.  Based on my evaluation with the patient patient meets criteria for capacity to make his own medical decisions and living arrangements.   Patient does not appear to be in dementia or delirium during my evaluation  Disposition: Supportive therapy provided about ongoing stressors.  Ambrose Finland, MD 01/01/2017 11:18 AM

## 2017-01-01 NOTE — Evaluation (Signed)
Physical Therapy Evaluation Patient Details Name: Edward Jordan MRN: [id number] DOB: 28-Dec-1960 Today's Date: 01/01/2017   History of Present Illness  Edward Jordan is a 56 y.o. male with PMH significant for HTN, presenting with 5 day history of poor po intake.  Apparently bed broke 5 days ago, and patient was unable to get off of floor for unknown reasons. Did not take any meds and had almost no po intake. Found covered in urine and feces. Some skin breakdown of perineum and medial thighs at arrival.   Work up revealed dehydration, hyponatremia and elevated creatinine.  Clinical Impression  Pt admitted with/for weakness, dehydration and complications above.  Pt needing moderate assist for mobility mobility.  Pt not able to progress past pivot to chair on eval..  Pt currently limited functionally due to the problems listed below.  (see problems list.)  Pt will benefit from PT to maximize function and safety to be able to get home safely with available assist.     Follow Up Recommendations Home health PT;Supervision/Assistance - 24 hour    Equipment Recommendations  Rolling walker with 5" wheels;3in1 (PT)    Recommendations for Other Services       Precautions / Restrictions Precautions Precautions: Fall Restrictions Weight Bearing Restrictions: No      Mobility  Bed Mobility Overal bed mobility: Needs Assistance Bed Mobility: Sidelying to Sit   Sidelying to sit: Mod assist       General bed mobility comments: bridged to EOB with min assist to prep for bridge and then no assist.  mod truncal assist up/forward onto L elbow.  Maximal assist to scoot R hip forward to EOB  Transfers Overall transfer level: Needs assistance Equipment used: Rolling walker (2 wheeled) Transfers: Sit to/from [facility] Sit to Stand: Mod assist;+2 physical assistance;+2 safety/equipment Stand pivot transfers: Min assist       General transfer comment: cues/demo for standing technique and  hand placement.  Assist to come both forward and up.  Assist to maneuver the RW and stability assist for pivot.  Ambulation/Gait             General Gait Details: pivot only  Stairs            Wheelchair Mobility    Modified Rankin (Stroke Patients Only)       Balance Overall balance assessment: Needs assistance   Sitting balance-Leahy Scale: Fair     Standing balance support: Bilateral upper extremity supported Standing balance-Leahy Scale: Poor Standing balance comment: still reliant on RW or external support                             Pertinent Vitals/Pain Pain Assessment: Faces Faces Pain Scale: Hurts whole lot Pain Location: bil feet Pain Descriptors / Indicators: Sore;Grimacing;Guarding Pain Intervention(s): Monitored during session    Home Living Family/patient expects to be discharged to:: Private residence Living Arrangements: Other (Comment) (room mates) Available Help at Discharge: Other (Comment) (pt reports there is really no one to help him out.) Type of Home: House         Home Equipment: None      Prior Function Level of Independence: Independent               Hand Dominance        Extremity/Trunk Assessment   Upper Extremity Assessment Upper Extremity Assessment: Generalized weakness (gross flexion/extension weak bil)    Lower Extremity Assessment Lower Extremity Assessment:  Generalized weakness;RLE deficits/detail;LLE deficits/detail RLE Deficits / Details: R foot sore dorsal/plantar surfaces, generalized weakness, hip flexors 3-, quads 3+,  hams 3+, df 3-, pf 3-,  RLE Coordination: decreased fine motor LLE Deficits / Details: similar overall as R LE , but mildly weaker. LLE Coordination: decreased fine motor       Communication   Communication: No difficulties  Cognition Arousal/Alertness: Awake/alert Behavior During Therapy: WFL for tasks assessed/performed (apathetic and mildly evasive) Overall  Cognitive Status: Impaired/Different from baseline Area of Impairment: Attention;Following commands;Safety/judgement;Awareness;Problem solving                   Current Attention Level: Selective   Following Commands: Follows one step commands with increased time;Follows one step commands inconsistently Safety/Judgement: Decreased awareness of deficits Awareness: Emergent Problem Solving: Slow processing;Decreased initiation;Difficulty sequencing;Requires verbal cues General Comments: Frequently saying " I don't know what you mean"      General Comments      Exercises     Assessment/Plan    PT Assessment Patient needs continued PT services  PT Problem List Decreased strength;Decreased range of motion;Decreased activity tolerance;Decreased balance;Decreased mobility;Decreased coordination;Decreased knowledge of use of DME;Pain       PT Treatment Interventions DME instruction;Gait training;Stair training;Functional mobility training;Therapeutic activities;Therapeutic exercise;Balance training;Patient/family education    PT Goals (Current goals can be found in the Care Plan section)  Acute Rehab PT Goals Patient Stated Goal: pt did not participate with goals--apathetic PT Goal Formulation: With patient Time For Goal Achievement: 01/08/17 Potential to Achieve Goals: Good    Frequency Min 3X/week   Barriers to discharge Decreased caregiver support      Co-evaluation               AM-PAC PT "6 Clicks" Daily Activity  Outcome Measure Difficulty turning over in bed (including adjusting bedclothes, sheets and blankets)?: Total Difficulty moving from lying on back to sitting on the side of the bed? : Total Difficulty sitting down on and standing up from a chair with arms (e.g., wheelchair, bedside commode, etc,.)?: Total Help needed moving to and from a bed to chair (including a wheelchair)?: A Lot Help needed walking in hospital room?: A Lot Help needed climbing  3-5 steps with a railing? : A Lot 6 Click Score: 9    End of Session   Activity Tolerance: Patient tolerated treatment well Patient left: in chair;with call bell/phone within reach;with chair alarm set Nurse Communication: Mobility status PT Visit Diagnosis: Unsteadiness on feet (R26.81);Other abnormalities of gait and mobility (R26.89);Muscle weakness (generalized) (M62.81);Pain Pain - Right/Left:  (both) Pain - part of body:  (bil feet)    Time: 1025-1050 PT Time Calculation (min) (ACUTE ONLY): 25 min   Charges:   PT Evaluation $PT Eval Moderate Complexity: 1 Procedure PT Treatments $Therapeutic Activity: 8-22 mins   PT G Codes:        Jan 07, 2017  Donnella Sham, PT [phone]  (pager)  Tessie Fass Paxtyn Wisdom 2017-01-07, 11:16 AM

## 2017-01-01 NOTE — Progress Notes (Signed)
Family Medicine Teaching Service Daily Progress Note Intern Pager: [phone]  Patient name: Edward Jordan Medical record number: [id number] Date of birth: 11/04/1960 Age: 56 y.o. Gender: male  Primary Care Provider: Patient, No Pcp Per Consultants: Nephrology  Code Status: Full   Pt Overview and Major Events to Date:  Edward Jordan is a 56 y.o. Male presenting with hypernatremia, dehydration, and AKI 2/2 prolonged immobility for 5 days. Patient was admitted to [facility] on 12/31/2016.  Assessment and Plan: Bon Dowis is a 56 y.o. male presenting with 5 day history of poor po intake. On admission found to be hypernatremia, dehydrated, and AKI. PMH is significant for htn, ckd, hld, morbid obesity, HFrEF   Dehydration Improved.  Patient with no po intake in setting of known CKD.  Cr is significantly elevated along with BUN, however pt continues to make urine and without signs of uremic encephalopathy or pericarditis. Pt refused HD yesterday and is unclear as to why.  He has refused long-term dialysis in the past.  Has history of HFrEF 35% per chart review in 2017 so will need to monitor fluid status very carefully.  Per nephrology recommendations, may benefit from a psychiatric evaluation to assess his understanding of his condition and to determine if he has capacity.  - will c/s psych this AM to determine capacity and assess for depression, appreciate recs  - Monitor volume status closely - Daily weights, Strict I/Os - daily bmp  Hypernatremia  Hyperuremia Resolved upon rehydration.  Na this AM of 139 wnl.  On admission with Na of 157.  Current BUN of 195. Has had little to no po intake for 5 days. -AM BMET   -Nephrology recs  -monitor fluid status  Hypertension Resolved.  BP this AM 118/60.  Had not been taking home meds for last 4-5 days. BP on admission elevated to 174/97. Will hold home lasix as patient is in acute renal failure 2/2 dehydration.  - vitals per floor routine  - continue coreg 50mg   bid - continue hydralazine 100mg  tid - continue imdur 60mg  daily  HFrEF Echo from 2017 showing EF 35-40%. Will need to monitor EKG. Monitor for any s/s of fluid overload given necessity for iv hydration 2/2 dehydration. If develops acute shortness of breath or ekg with concerning changes, will possibly need re-evaluation.  - continue coreg - monitor cardiac status  Skin Breakdown Patient soiled in own urine and feces for 4 days. Areas of irritation in perineum and medial thighs. Tender to touch but area clean and dry on inspection. Will need to follow closely, no concern at this time for violation of dermis. No signs of Fournier gangrene on exam.  Morbid obesity Patient apparently unable to get up after bed broke for 4-5 days. Will have pt/ot assess function status while here. Pt likely has sedentary lifestyle but concerning due to pts limited hx of social and living situation. - F/U PT/OT recommendations - Could consider getting CSW involved  Hyperlipidemia Listed in problem list. Lipid panel from 04/2016 grossly normal aside from low hdl. Doesn't take any medication. Will likely need to follow this up as an outpatient.    Hyperglycemia A1c on this admission 5.3.  Patient with elevated glucose upon admission of 140.  Current glucose 175. A1c in November 5.6.  - F/U blood glucose on daily bmp  FEN/GI: heart healthy, carb modified  PPx: SCDs, heparin   Disposition: continue to monitor   Subjective:  Pt states he feels well today.  Remains uncooperative with  exam and is short when answering questions.  Refuses HD and did not give a reason as to why when I asked.  He appears coherent and is oriented.   Objective: Temp:  [97.5 F (36.4 C)-98.2 F (36.8 C)] 98.2 F (36.8 C) (07/21 0446) Pulse Rate:  [56-63] 63 (07/21 0446) Resp:  [18-19] 18 (07/21 0446) BP: (103-118)/(55-64) 117/64 (07/21 0951) SpO2:  [99 %-100 %] 100 % (07/21 0446) Weight:  [301 lb 5.9 oz (136.7 kg)] 301 lb  5.9 oz (136.7 kg) (07/21 0446)   Physical Exam: General: 56 yo M, awake and alert  Cardiovascular: RRR, no MRG Respiratory: CTAB, WOB is comfortable  Abdomen: soft, NTND, +bs  Extremities: no edema or tenderness noted  Skin: area of breakdown on perineum  Psych: mood irritable   Laboratory:  Recent Labs Lab 12/30/16 2145 12/31/16 0340 01/01/17 0652  WBC 19.1* 18.2* 11.6*  HGB 11.9* 10.9* 8.9*  HCT 35.7* 32.5* 26.5*  PLT 157 133* 132*    Recent Labs Lab 12/30/16 2145  12/31/16 1145 12/31/16 1953 01/01/17 0652  NA 157*  < > 142 140 139  K 5.1  < > 4.5 4.5 4.6  CL 126*  < > 115* 113* 112*  CO2 11*  < > 12* 13* 11*  BUN 199*  < > 180* 187* 195*  CREATININE 13.24*  < > 11.78* 11.61* 11.87*  CALCIUM 8.3*  < > 7.4* 7.4* 7.4*  PROT 7.9  --   --   --   --   BILITOT 0.9  --   --   --   --   ALKPHOS 57  --   --   --   --   ALT 12*  --   --   --   --   AST 14*  --   --   --   --   GLUCOSE 139*  < > 178* 144* 106*  < > = values in this interval not displayed.  Imaging/Diagnostic Tests: No results found.   Lovenia Kim, MD 01/01/2017, 10:58 AM PGY-2, [place] Intern pager: [phone], text pages welcome

## 2017-01-02 ENCOUNTER — Inpatient Hospital Stay (HOSPITAL_COMMUNITY): Payer: Self-pay

## 2017-01-02 DIAGNOSIS — Z01810 Encounter for preprocedural cardiovascular examination: Secondary | ICD-10-CM

## 2017-01-02 DIAGNOSIS — N185 Chronic kidney disease, stage 5: Secondary | ICD-10-CM

## 2017-01-02 DIAGNOSIS — I10 Essential (primary) hypertension: Secondary | ICD-10-CM

## 2017-01-02 LAB — BASIC METABOLIC PANEL
Anion gap: 19 — ABNORMAL HIGH (ref 5–15)
BUN: 205 mg/dL — AB (ref 6–20)
CO2: 9 mmol/L — ABNORMAL LOW (ref 22–32)
CREATININE: 12.47 mg/dL — AB (ref 0.61–1.24)
Calcium: 6.9 mg/dL — ABNORMAL LOW (ref 8.9–10.3)
Chloride: 112 mmol/L — ABNORMAL HIGH (ref 101–111)
GFR calc Af Amer: 5 mL/min — ABNORMAL LOW (ref >60)
GFR, EST NON AFRICAN AMERICAN: 4 mL/min — AB (ref >60)
GLUCOSE: 112 mg/dL — AB (ref 65–99)
Potassium: 4.7 mmol/L (ref 3.5–5.1)
SODIUM: 140 mmol/L (ref 135–145)

## 2017-01-02 LAB — CBC
HCT: 26.2 % — ABNORMAL LOW (ref 39.0–52.0)
Hemoglobin: 8.9 g/dL — ABNORMAL LOW (ref 13.0–17.0)
MCH: 27.1 pg (ref 26.0–34.0)
MCHC: 34 g/dL (ref 30.0–36.0)
MCV: 79.9 fL (ref 78.0–100.0)
PLATELETS: 120 10*3/uL — AB (ref 150–400)
RBC: 3.28 MIL/uL — ABNORMAL LOW (ref 4.22–5.81)
RDW: 16.7 % — ABNORMAL HIGH (ref 11.5–15.5)
WBC: 9.9 10*3/uL (ref 4.0–10.5)

## 2017-01-02 LAB — PHOSPHORUS: PHOSPHORUS: 7.8 mg/dL — AB (ref 2.5–4.6)

## 2017-01-02 NOTE — Progress Notes (Signed)
VASCULAR LAB PRELIMINARY  PRELIMINARY  PRELIMINARY  PRELIMINARY  Upper Extremity Vein Map    Right Cephalic  Segment Diameter Depth Comment  1. Axilla 4.8mm 17.19mm   2. Mid upper arm 5.82mm 9.39mm branch  3. Above AC 4.85mm 7.51mm   4. In [facility] 4.4mm 3.37mm   5. Below AC 5.84mm 6.56mm branch  6. Mid forearm 3.81mm 5.13mm   7. Wrist 3.80mm mm    mm mm    mm mm    mm mm    Left Cephalic  Segment Diameter Depth Comment  1. Axilla 5.85mm 12.35mm   2. Mid upper arm 5.34mm 7.52mm branch  3. Above AC 5.4mm 6.20mm   4. In AC 6.45mm mm   5. Below AC 5.65mm 4.68mm branch  6. Mid forearm 3.80mm 4.28mm   7. Wrist 3.46mm mm    mm mm    mm mm    mm mm      Landry Mellow, RDMS, RVT   01/02/2017, 11:58 AM

## 2017-01-02 NOTE — Evaluation (Signed)
Occupational Therapy Evaluation Patient Details Name: Edward Jordan MRN: [id number] DOB: Jan 07, 1961 Today's Date: 01/02/2017    History of Present Illness Edward Jordan is a 56 y.o. male with PMH significant for HTN, presenting with 5 day history of poor po intake.  Apparently bed broke 5 days ago, and patient was unable to get off of floor for unknown reasons. Did not take any meds and had almost no po intake. Found covered in urine and feces. Some skin breakdown of perineum and medial thighs at arrival.   Work up revealed dehydration, hyponatremia and elevated creatinine.   Clinical Impression   PTA, pt reports independence with ADL and functional mobility and working. He currently requires mod assist +2 for toilet transfers and total assist for LB ADL and toileting hygiene. Pt presented to OT with significant apathy fluctuating with frustration throughout session and decreased awareness. Cognitive and physical deficits are impacting his ability to participate in ADL at [facility]. At current functional level feel pt would benefit from SNF level rehabilitation in order to maximize safety and independence with ADL post-acute D/C. However, will continue to update D/C recommendations based on pt progress. Note plan to begin HD. OT will continue to follow while admitted.     Follow Up Recommendations  SNF (vs. HHOT depending on progress)    Equipment Recommendations  3 in 1 bedside commode;Tub/shower bench    Recommendations for Other Services       Precautions / Restrictions Precautions Precautions: Fall Restrictions Weight Bearing Restrictions: No      Mobility Bed Mobility Overal bed mobility: Needs Assistance Bed Mobility: Sidelying to Sit   Sidelying to sit: Max assist       General bed mobility comments: Max assist to manage B LE and trunk from bed.   Transfers Overall transfer level: Needs assistance Equipment used: Rolling walker (2 wheeled) Transfers: Sit to/from Stand Sit to  Stand: Mod assist;+2 physical assistance;+2 safety/equipment         General transfer comment: Pt reporting "this is all mental." Reporting fear of falling.     Balance Overall balance assessment: Needs assistance   Sitting balance-Leahy Scale: Fair     Standing balance support: Bilateral upper extremity supported Standing balance-Leahy Scale: Poor Standing balance comment: Reliant on RW                           ADL either performed or assessed with clinical judgement   ADL Overall ADL's : Needs assistance/impaired Eating/Feeding: Set up;Sitting   Grooming: Set up;Sitting   Upper Body Bathing: Minimal assistance;Sitting   Lower Body Bathing: Total assistance   Upper Body Dressing : Minimal assistance;Sitting   Lower Body Dressing: Total assistance   Toilet Transfer: +2 for physical assistance;Moderate assistance   Toileting- Clothing Manipulation and Hygiene: Total assistance;Sit to/from stand         General ADL Comments: Pt requiring maximum encouragement and education to motivate to participate in session.      Vision         Perception     Praxis      Pertinent Vitals/Pain Pain Assessment: Faces Faces Pain Scale: Hurts even more Pain Location: bil feet Pain Descriptors / Indicators: Sore;Grimacing;Guarding Pain Intervention(s): Limited activity within patient's tolerance;Monitored during session;Repositioned     Hand Dominance     Extremity/Trunk Assessment Upper Extremity Assessment Upper Extremity Assessment: Generalized weakness (shaking during movement and MMT)   Lower Extremity Assessment Lower Extremity Assessment: Defer to  PT evaluation       Communication Communication Communication: No difficulties   Cognition Arousal/Alertness: Awake/alert Behavior During Therapy:  (pt frustrated and apathetic) Overall Cognitive Status: Impaired/Different from baseline Area of Impairment: Attention;Following  commands;Safety/judgement;Awareness;Problem solving                   Current Attention Level: Selective   Following Commands: Follows one step commands with increased time;Follows one step commands inconsistently Safety/Judgement: Decreased awareness of deficits Awareness: Emergent Problem Solving: Slow processing;Decreased initiation;Difficulty sequencing;Requires verbal cues General Comments: Pt demonstrating decreased understanding of current limitations.    General Comments       Exercises     Shoulder Instructions      Home Living Family/patient expects to be discharged to:: Private residence Living Arrangements: Other (Comment) (did not specify) Available Help at Discharge: Other (Comment) (pt reports no one will be able to assist) Type of Home: House             Bathroom Shower/Tub: Tub/shower unit         Home Equipment: None   Additional Comments: Pt hesitant to answer home living questions      Prior Functioning/Environment Level of Independence: Independent                 OT Problem List: Decreased strength;Decreased range of motion;Decreased activity tolerance;Impaired balance (sitting and/or standing);Decreased knowledge of use of DME or AE;Decreased safety awareness;Decreased cognition;Decreased knowledge of precautions;Pain      OT Treatment/Interventions: Self-care/ADL training;Therapeutic exercise;Energy conservation;DME and/or AE instruction;Therapeutic activities;Cognitive remediation/compensation;Patient/family education;Balance training    OT Goals(Current goals can be found in the care plan section) Acute Rehab OT Goals Patient Stated Goal: pt did not participate with goals--apathetic OT Goal Formulation: With patient Time For Goal Achievement: 01/16/17 Potential to Achieve Goals: Good ADL Goals Pt Will Perform Grooming: standing;with modified independence (including gathering items) Pt Will Perform Upper Body Dressing: with  modified independence;sitting Pt Will Perform Lower Body Dressing: sit to/from stand;with modified independence;with adaptive equipment Pt Will Transfer to Toilet: with modified independence;ambulating;bedside commode Pt Will Perform Toileting - Clothing Manipulation and hygiene: with modified independence;sit to/from stand  OT Frequency: Min 2X/week   Barriers to D/C:            Co-evaluation              AM-PAC PT "6 Clicks" Daily Activity     Outcome Measure Help from another person eating meals?: A Little Help from another person taking care of personal grooming?: A Little Help from another person toileting, which includes using toliet, bedpan, or urinal?: Total Help from another person bathing (including washing, rinsing, drying)?: A Lot Help from another person to put on and taking off regular upper body clothing?: A Little Help from another person to put on and taking off regular lower body clothing?: A Lot 6 Click Score: 14   End of Session Equipment Utilized During Treatment: Gait belt;Rolling walker Nurse Communication: Mobility status  Activity Tolerance: Patient tolerated treatment well Patient left: in bed;with call bell/phone within reach  OT Visit Diagnosis: Unsteadiness on feet (R26.81);Muscle weakness (generalized) (M62.81);Other symptoms and signs involving cognitive function;Pain Pain - Right/Left:  (bilaterl) Pain - part of body: Ankle and joints of foot                Time: 1435-1525 OT Time Calculation (min): 50 min Charges:  OT General Charges $OT Visit: 1 Procedure OT Evaluation $OT Eval Moderate Complexity: 1 Procedure OT Treatments $  Self Care/Home Management : 23-37 mins G-Codes:     Norman Herrlich, MS OTR/L  Pager: [place] A Delailah Spieth 01/02/2017, 4:31 PM

## 2017-01-02 NOTE — Consult Note (Addendum)
Requested by:  Dr. Pearson Grippe (Nephrology)  Reason for consultation: New access   History of Present Illness   Edward Jordan is a 56 y.o. ([birth year]) male who presents for evaluation for permanent access.  The patient is rogjt hand dominant.  The patient has not had previous access procedures.  Previous central venous cannulation procedures include: none.  The patient has never had a PPM placed.  This patient was found down for reported 5 days during which he was stuck in his bed unable to get up.  Reportedly initially the patient was resistant to proceeding with HD but as agreed to proceed at this point.  Past Medical History:  Diagnosis Date  . Hypertension     Past Surgical History:  Procedure Laterality Date  . NO PAST SURGERIES      Social History   Social History  . Marital status: Legally Separated    Spouse name: N/A  . Number of children: N/A  . Years of education: N/A   Occupational History  . Not on file.   Social History Main Topics  . Smoking status: Never Smoker  . Smokeless tobacco: Never Used  . Alcohol use Yes     Comment: ocassionally  . Drug use: No  . Sexual activity: No   Other Topics Concern  . Not on file   Social History Narrative  . No narrative on file   Family History: patient is unable to detail the medical history of his parents  Current Facility-Administered Medications  Medication Dose Route Frequency Provider Last Rate Last Dose  . acetaminophen (TYLENOL) tablet 650 mg  650 mg Oral Q6H PRN Guadalupe Dawn, MD       Or  . acetaminophen (TYLENOL) suppository 650 mg  650 mg Rectal Q6H PRN Guadalupe Dawn, MD      . aspirin EC tablet 81 mg  81 mg Oral Daily Guadalupe Dawn, MD   81 mg at 01/02/17 1013  . calcitRIOL (ROCALTROL) capsule 0.25 mcg  0.25 mcg Oral Daily Guadalupe Dawn, MD   0.25 mcg at 01/02/17 1012  . carvedilol (COREG) tablet 50 mg  50 mg Oral BID WC Forde Dandy, MD   50 mg at 01/02/17 1013  . heparin injection  5,000 Units  5,000 Units Subcutaneous Q8H Guadalupe Dawn, MD   5,000 Units at 01/02/17 [phone]  . hydrALAZINE (APRESOLINE) tablet 100 mg  100 mg Oral TID Forde Dandy, MD   100 mg at 01/02/17 1013  . isosorbide mononitrate (IMDUR) 24 hr tablet 60 mg  60 mg Oral Daily Forde Dandy, MD   60 mg at 01/02/17 1015  . ondansetron (ZOFRAN) tablet 4 mg  4 mg Oral Q6H PRN Guadalupe Dawn, MD       Or  . ondansetron [facility]) injection 4 mg  4 mg Intravenous Q6H PRN Guadalupe Dawn, MD        No Known Allergies  REVIEW OF SYSTEMS (negative unless checked):   Cardiac:  []  Chest pain or chest pressure? []  Shortness of breath upon activity? []  Shortness of breath when lying flat? []  Irregular heart rhythm?  Vascular:  []  Pain in calf, thigh, or hip brought on by walking? []  Pain in feet at night that wakes you up from your sleep? []  Blood clot in your veins? []  Leg swelling?  Pulmonary:  []  Oxygen at home? []  Productive cough? []  Wheezing?  Neurologic:  [x]  Sudden weakness in arms or legs? []  Sudden numbness in arms or  legs? []  Sudden onset of difficult speaking or slurred speech? []  Temporary loss of vision in one eye? []  Problems with dizziness?  Gastrointestinal:  []  Blood in stool? []  Vomited blood?  Genitourinary:  []  Burning when urinating? []  Blood in urine?  Psychiatric:  []  Major depression  Hematologic:  []  Bleeding problems? []  Problems with blood clotting?  Dermatologic:  [x]  Rashes or ulcers?  Constitutional:  []  Fever or chills?  Ear/Nose/Throat:  []  Change in hearing? []  Nose bleeds? []  Sore throat?  Musculoskeletal:  []  Back pain? []  Joint pain? []  Muscle pain?   Physical Examination     Vitals:   01/01/17 1600 01/01/17 2130 01/02/17 0615 01/02/17 1012  BP: 102/65 112/61 118/68 127/65  Pulse: (!) 58 65 68   Resp:   18   Temp:  98.8 F (37.1 C) 98.4 F (36.9 C)   TempSrc:  Oral Oral   SpO2:  100% 99%   Weight:   293 lb 6.9 oz (133.1 kg)    Height:       Body mass index is 39.8 kg/m.  General Alert, O x 3, Obese, NAD  [place]/AT,    Ear/Nose/ Throat Hearing grossly intact, nares without erythema or drainage, oropharynx without Erythema or Exudate, Mallampati score: 3,   Eyes PERRLA, EOMI,    Neck Supple, mid-line trachea,    Pulmonary Sym exp, good B air movt, CTA B  Cardiac RRR, Nl S1, S2, no Murmurs, No rubs, No S3,S4  Vascular Vessel Right Left  Radial Palpable Palpable  Brachial Palpable Palpable  Carotid Palpable, No Bruit Palpable, No Bruit  Aorta Not palpable N/A  Femoral Not palpable due to pannus Not palpable due to pannus  Popliteal Not palpable Not palpable  PT Faintly palpable Faintly palpable  DP Palpable Palpable    Gastro- intestinal soft, non-distended, non-tender to palpation, No guarding or rebound, no HSM, no masses, no CVAT B, No palpable prominent aortic pulse,    Musculo- skeletal M/S 5/5 throughout BUE, BLE 3/5, Extremities without ischemic changes  , Non-pitting edema present: B 1-2+ (increasing distally), No visible varicosities , No Lipodermatosclerosis present, chronic venous stasis skin changes B  Neurologic Cranial nerves 2-12 intact , Pain and light touch intact in extremities , Motor exam as listed above  Psychiatric Judgement intact, Mood & affect appropriate for pt's clinical situation  Dermatologic See M/S exam for extremity exam, No rashes otherwise noted  Lymphatic  Palpable lymph nodes: None     Non-invasive Vascular Imaging   BUE Vein Mapping  (Date: 01/02/2017):   Right Cephalic  Segment Diameter Depth Comment  1. Axilla 4.59mm 17.45mm   2. Mid upper arm 5.10mm 9.79mm branch  3. Above AC 4.61mm 7.25mm   4. In [facility] 4.94mm 3.25mm   5. Below AC 5.51mm 6.14mm branch  6. Mid forearm 3.28mm 5.38mm   7. Wrist 3.20mm mm    mm mm    mm mm    mm mm    Left Cephalic  Segment Diameter Depth Comment  1. Axilla 5.1mm 12.19mm   2. Mid upper arm 5.51mm 7.8mm  branch  3. Above AC 5.2mm 6.32mm   4. In AC 6.10mm mm   5. Below AC 5.65mm 4.98mm branch  6. Mid forearm 3.20mm 4.72mm   7. Wrist 3.39mm mm    mm mm    mm mm    mm mm     Laboratory   CBC CBC Latest Ref Rng & Units 01/02/2017 01/01/2017 12/31/2016  WBC 4.0 - 10.5 K/uL 9.9 11.6(H) 18.2(H)  Hemoglobin 13.0 - 17.0 g/dL 8.9(L) 8.9(L) 10.9(L)  Hematocrit 39.0 - 52.0 % 26.2(L) 26.5(L) 32.5(L)  Platelets 150 - 400 K/uL 120(L) 132(L) 133(L)    BMP BMP Latest Ref Rng & Units 01/02/2017 01/01/2017 12/31/2016  Glucose 65 - 99 mg/dL 112(H) 106(H) 144(H)  BUN 6 - 20 mg/dL 205(H) 195(H) 187(H)  Creatinine 0.61 - 1.24 mg/dL 12.47(H) 11.87(H) 11.61(H)  Sodium 135 - 145 mmol/L 140 139 140  Potassium 3.5 - 5.1 mmol/L 4.7 4.6 4.5  Chloride 101 - 111 mmol/L 112(H) 112(H) 113(H)  CO2 22 - 32 mmol/L 9(L) 11(L) 13(L)  Calcium 8.9 - 10.3 mg/dL 6.9(L) 7.4(L) 7.4(L)    Coagulation No results found for: INR, PROTIME No results found for: PTT  Lipids    Component Value Date/Time   CHOL 118 04/20/2016 0114   TRIG 50 04/20/2016 0114   HDL 29 (L) 04/20/2016 0114   CHOLHDL 4.1 04/20/2016 0114   VLDL 10 04/20/2016 0114   [place] 79 04/20/2016 0114    Medical Decision Making   Edward Jordan is a 56 y.o. male who presents with end stage renal disease requiring hemodialysis, uremia, resolving hypernatremia, HTN, morbid obesity  Based on vein mapping and examination, this patient's permanent access options include: bilateral radiocephalic vs brachiocephalic arteriovenous fistula.  I would start with left radiocephalic vs brachiocephalic arteriovenous fistula, tunneled dialysis placement.I had an extensive discussion with this patient in regards to the nature of access surgery, including risk, benefits, and alternatives.    The patient is aware that the risks of access surgery include but are not limited to: bleeding, infection, steal syndrome, nerve damage, ischemic monomelic neuropathy, failure  of access to mature, complications related to venous hypertension, and possible need for additional access procedures in the future. The patient is aware the risks of tunneled dialysis catheter placement include but are not limited to: bleeding, infection, central venous injury, pneumothorax, possible venous stenosis, possible malpositioning in the venous system, and possible infections related to long-term catheter presence.  The patient was aware of these risks and agreed to proceed. Will add patient onto Dr. Freddie Breech schedule for tomorrow depending on surgeon and room availability.   Adele Barthel, MD, FACS [facility] Office: [phone] Pager: [phone]  01/02/2017, 2:39 PM

## 2017-01-02 NOTE — Progress Notes (Signed)
Family Medicine Teaching Service Daily Progress Note Intern Pager: [phone]  Patient name: Edward Jordan Medical record number: [id number] Date of birth: September 29, 1960 Age: 56 y.o. Gender: male  Primary Care Provider: Patient, No Pcp Per Consultants: Nephrology  Code Status: Full   Pt Overview and Major Events to Date:  Edward Jordan is a 56 y.o. Male presenting with hypernatremia, dehydration, and AKI 2/2 prolonged immobility for 5 days. Patient was admitted to [facility] on 12/31/2016.  Assessment and Plan: Edward Jordan is a 56 y.o. male presenting with 5 day history of poor po intake. On admission found to be hypernatremia, dehydrated, and AKI. PMH is significant for htn, ckd, hld, morbid obesity, HFrEF   Dehydration Improved.  Patient with no po intake in setting of known CKD.  Cr is significantly elevated along with BUN, however pt continues to make urine and without signs of uremic encephalopathy or pericarditis. Pt refused HD Friday and is unclear as to why.  He has refused long-term dialysis in the past.  Has history of HFrEF 35% per chart review in 2017 so will need to monitor fluid status very carefully.  Per nephrology recommendations, psychiatry consulted to assess for capacity.   - psych c/s > pt has capacity   - Monitor volume status closely - Daily weights, Strict I/Os  - daily bmp  Hypernatremia  Hyperuremia Resolved upon rehydration.  Na this AM of 140 wnl.  On admission with Na of 157.  Current BUN of 195. Has had little to no po intake for 5 days. -AM BMET   -Nephrology recs  -monitor fluid status  Hypertension Resolved.  BP this AM 118/68.  Had not been taking home meds for last 4-5 days. BP on admission elevated to 174/97. Holding home lasix as patient is in acute renal failure 2/2 dehydration. - vitals per floor routine  - continue coreg 50mg  bid  - continue hydralazine 100mg  tid  - continue imdur 60mg  daily   HFrEF Echo from 2017 showing EF 35-40%. Will need to monitor EKG.  Monitor for any s/s of fluid overload given necessity for iv hydration 2/2 dehydration. If develops acute shortness of breath or ekg with concerning changes, will possibly need re-evaluation.  - continue coreg - monitor cardiac status  Skin Breakdown Patient soiled in own urine and feces for 4 days. Areas of irritation in perineum and medial thighs. Tender to touch but area clean and dry on inspection. Will need to follow closely, no concern at this time for violation of dermis. No signs of Fournier gangrene on exam.   Morbid obesity Patient apparently unable to get up after bed broke for 4-5 days. Will have pt/ot assess function status while here. Pt likely has sedentary lifestyle but concerning due to pts limited hx of social and living situation. - PT - recommended HH PT and rolling walker with 5" wheels 3-in-1 -OT recs  Hyperlipidemia Listed in problem list. Lipid panel from 04/2016 grossly normal aside from low hdl. Doesn't take any medication. Will likely need to follow this up as an outpatient.    Hyperglycemia A1c on this admission 5.3.  Patient with elevated glucose upon admission of 140.  Current glucose 175. A1c in November 5.6.  - F/U blood glucose on daily bmp   FEN/GI: heart healthy, carb modified  PPx: SCDs, heparin   Disposition: continue to monitor   Subjective:  Pt complaining of leg pain this morning but does not elaborate on it.  Seems irritable and is short with his  answers. Psych saw yesterday and deemed him to be oriented and has capacity.   Objective: Temp:  [98.1 F (36.7 C)-98.8 F (37.1 C)] 98.4 F (36.9 C) (07/22 0615) Pulse Rate:  [58-68] 68 (07/22 0615) Resp:  [16-18] 18 (07/22 0615) BP: (99-118)/(53-68) 118/68 (07/22 0615) SpO2:  [99 %-100 %] 99 % (07/22 0615) Weight:  [293 lb 6.9 oz (133.1 kg)] 293 lb 6.9 oz (133.1 kg) (07/22 0615)   Physical Exam: General: 56 yo M, alert, in no acute distress  Cardiovascular: RRR, no MRG Respiratory: CTAB,  no wheezes  Abdomen: soft, NTND, +bs  Extremities: no swelling noted, SCDs in place  Psych: mood irritable   Laboratory:  Recent Labs Lab 12/31/16 0340 01/01/17 0652 01/02/17 0544  WBC 18.2* 11.6* 9.9  HGB 10.9* 8.9* 8.9*  HCT 32.5* 26.5* 26.2*  PLT 133* 132* 120*    Recent Labs Lab 12/30/16 2145  12/31/16 1953 01/01/17 0652 01/02/17 0544  NA 157*  < > 140 139 140  K 5.1  < > 4.5 4.6 4.7  CL 126*  < > 113* 112* 112*  CO2 11*  < > 13* 11* 9*  BUN 199*  < > 187* 195* 205*  CREATININE 13.24*  < > 11.61* 11.87* 12.47*  CALCIUM 8.3*  < > 7.4* 7.4* 6.9*  PROT 7.9  --   --   --   --   BILITOT 0.9  --   --   --   --   ALKPHOS 57  --   --   --   --   ALT 12*  --   --   --   --   AST 14*  --   --   --   --   GLUCOSE 139*  < > 144* 106* 112*  < > = values in this interval not displayed.  Imaging/Diagnostic Tests: No results found.   Lovenia Kim, MD 01/02/2017, 9:08 AM PGY-2, [place] Intern pager: [phone], text pages welcome

## 2017-01-02 NOTE — Progress Notes (Signed)
OT Cancellation Note  Patient Details Name: Edward Jordan MRN: [id number] DOB: [birth year]   Cancelled Treatment:    Reason Eval/Treat Not Completed: Patient at procedure or test/ unavailable. Pt off unit at vascular lab on OT arrival. Will check back as able to initiate evaluation.   Norman Herrlich, MS OTR/L  Pager: [phone]   Norman Herrlich 01/02/2017, 12:02 PM

## 2017-01-02 NOTE — Progress Notes (Signed)
Admit: 12/30/2016 LOS: 2  Edward Jordan progressive CKD5, now ESRD, admit with hypernatremia/dehydration, uremia  Subjective:  I came back positive patient after psychiatric evaluation. He was felt to have capacity to make decisions for himself. He does not appear to have any suicidal ideation.  His serum sodium has normalized but he has significant renal failure and azotemia.  Today he was more open in conversation.  Length I discussed with him his renal disease. I let him know that without dialysis. I expect he would die within the next several months, at the longest.  I acknowledged his concerns and worries about starting dialysis and at social impact. I let him know the many dialysis patients in the community deal with these same concerns. We discussed the logistics of maintenance hemodialysis as well as vascular access. I strongly encouraged him that I could assist him with his swelling and that he has significant uremia which has come on in an indolent manner that I could assist him with over time as well.  My strong recommendation to him was to initiate hemodialysis. He is willing to move forward at this time. He questions about vascular access, which I addressed. I will ask vascular surgery to see the patient for tunneled catheter and a fistula.   07/21 0701 - 07/22 0700 In: 240 [P.O.:240] Out: 600 [Urine:600]  Filed Weights   12/31/16 0245 01/01/17 0446 01/02/17 0615  Weight: 128.1 kg (282 lb 6.6 oz) (!) 136.7 kg (301 lb 5.9 oz) 133.1 kg (293 lb 6.9 oz)    Scheduled Meds: . aspirin EC  81 mg Oral Daily  . calcitRIOL  0.25 mcg Oral Daily  . carvedilol  50 mg Oral BID WC  . heparin  5,000 Units Subcutaneous Q8H  . hydrALAZINE  100 mg Oral TID  . isosorbide mononitrate  60 mg Oral Daily   Continuous Infusions: PRN Meds:.acetaminophen **OR** acetaminophen, ondansetron **OR** ondansetron (ZOFRAN) IV  Current Labs: reviewed    Physical Exam:  Blood pressure 118/68, pulse 68,  temperature 98.4 F (36.9 C), temperature source Oral, resp. rate 18, height 6' (1.829 m), weight 133.1 kg (293 lb 6.9 oz), SpO2 99 %. NAD, obese RRR, no rub CTAB, nl wob 1+ LEE No rashes/lesions  A 1. New ESRD, uremia, volume overload 2. Hypernatremia/dehydration; resolved with free water 3. HTN, stable  P 1. VVS to eval for [facility] and AVF, vein mapping ordered 2. Begin HD after TDC placed 3. Will need to CLIP patient tomorrow 4. Cont to support/education about HD and living with dialysis   Pearson Grippe MD 01/02/2017, 10:06 AM   Recent Labs Lab 12/31/16 1953 01/01/17 0652 01/02/17 0544  NA 140 139 140  K 4.5 4.6 4.7  CL 113* 112* 112*  CO2 13* 11* 9*  GLUCOSE 144* 106* 112*  BUN 187* 195* 205*  CREATININE 11.61* 11.87* 12.47*  CALCIUM 7.4* 7.4* 6.9*    Recent Labs Lab 12/30/16 2145 12/31/16 0340 01/01/17 0652 01/02/17 0544  WBC 19.1* 18.2* 11.6* 9.9  NEUTROABS 17.5*  --   --   --   HGB 11.9* 10.9* 8.9* 8.9*  HCT 35.7* 32.5* 26.5* 26.2*  MCV 81.7 81.3 80.3 79.9  PLT 157 133* 132* 120*

## 2017-01-03 ENCOUNTER — Inpatient Hospital Stay (HOSPITAL_COMMUNITY): Payer: Self-pay

## 2017-01-03 ENCOUNTER — Inpatient Hospital Stay (HOSPITAL_COMMUNITY): Payer: Self-pay | Admitting: Certified Registered Nurse Anesthetist

## 2017-01-03 ENCOUNTER — Encounter (HOSPITAL_COMMUNITY)
Admission: EM | Disposition: A | Discharge status: Home / Self Care | Payer: Self-pay | Source: Home / Self Care | Attending: Family Medicine

## 2017-01-03 ENCOUNTER — Encounter (HOSPITAL_COMMUNITY): Payer: Self-pay | Admitting: Anesthesiology

## 2017-01-03 HISTORY — PX: INSERTION OF DIALYSIS CATHETER: SHX1324

## 2017-01-03 HISTORY — PX: AV FISTULA PLACEMENT: SHX1204

## 2017-01-03 LAB — BASIC METABOLIC PANEL WITH GFR
Anion gap: 16 — ABNORMAL HIGH (ref 5–15)
BUN: 211 mg/dL — ABNORMAL HIGH (ref 6–20)
CO2: 10 mmol/L — ABNORMAL LOW (ref 22–32)
Calcium: 6.6 mg/dL — ABNORMAL LOW (ref 8.9–10.3)
Chloride: 111 mmol/L (ref 101–111)
Creatinine, Ser: 12.73 mg/dL — ABNORMAL HIGH (ref 0.61–1.24)
GFR calc Af Amer: 4 mL/min — ABNORMAL LOW
GFR calc non Af Amer: 4 mL/min — ABNORMAL LOW
Glucose, Bld: 112 mg/dL — ABNORMAL HIGH (ref 65–99)
Potassium: 4.8 mmol/L (ref 3.5–5.1)
Sodium: 137 mmol/L (ref 135–145)

## 2017-01-03 LAB — RENAL FUNCTION PANEL
ANION GAP: 15 (ref 5–15)
Albumin: 2.5 g/dL — ABNORMAL LOW (ref 3.5–5.0)
BUN: 212 mg/dL — ABNORMAL HIGH (ref 6–20)
CHLORIDE: 113 mmol/L — AB (ref 101–111)
CO2: 10 mmol/L — ABNORMAL LOW (ref 22–32)
Calcium: 7.2 mg/dL — ABNORMAL LOW (ref 8.9–10.3)
Creatinine, Ser: 13.09 mg/dL — ABNORMAL HIGH (ref 0.61–1.24)
GFR, EST AFRICAN AMERICAN: 4 mL/min — AB (ref >60)
GFR, EST NON AFRICAN AMERICAN: 4 mL/min — AB (ref >60)
Glucose, Bld: 127 mg/dL — ABNORMAL HIGH (ref 65–99)
PHOSPHORUS: 9.6 mg/dL — AB (ref 2.5–4.6)
POTASSIUM: 5.5 mmol/L — AB (ref 3.5–5.1)
Sodium: 138 mmol/L (ref 135–145)

## 2017-01-03 LAB — CBC
HEMATOCRIT: 26.3 % — AB (ref 39.0–52.0)
Hemoglobin: 8.8 g/dL — ABNORMAL LOW (ref 13.0–17.0)
MCH: 26.9 pg (ref 26.0–34.0)
MCHC: 33.5 g/dL (ref 30.0–36.0)
MCV: 80.4 fL (ref 78.0–100.0)
Platelets: 133 10*3/uL — ABNORMAL LOW (ref 150–400)
RBC: 3.27 MIL/uL — AB (ref 4.22–5.81)
RDW: 16.4 % — ABNORMAL HIGH (ref 11.5–15.5)
WBC: 10.5 10*3/uL (ref 4.0–10.5)

## 2017-01-03 LAB — SURGICAL PCR SCREEN
MRSA, PCR: NEGATIVE
Staphylococcus aureus: POSITIVE — AB

## 2017-01-03 SURGERY — INSERTION OF DIALYSIS CATHETER
Anesthesia: General | Laterality: Right

## 2017-01-03 MED ORDER — SODIUM CHLORIDE 0.9 % IV SOLN
INTRAVENOUS | Status: DC
  Administered 2017-01-03: 13:00:00 via INTRAVENOUS

## 2017-01-03 MED ORDER — MIDAZOLAM HCL 2 MG/2ML IJ SOLN
INTRAMUSCULAR | Status: AC
  Filled 2017-01-03: qty 2

## 2017-01-03 MED ORDER — CHLORHEXIDINE GLUCONATE CLOTH 2 % EX PADS
6.0000 | MEDICATED_PAD | Freq: Every day | CUTANEOUS | Status: AC
Start: 2017-01-03 — End: 2017-01-08
  Administered 2017-01-04 – 2017-01-06 (×2): 6 via TOPICAL

## 2017-01-03 MED ORDER — SUCCINYLCHOLINE CHLORIDE 200 MG/10ML IV SOSY
PREFILLED_SYRINGE | INTRAVENOUS | Status: AC
  Filled 2017-01-03: qty 10

## 2017-01-03 MED ORDER — PENTAFLUOROPROP-TETRAFLUOROETH EX AERO
1.0000 | INHALATION_SPRAY | CUTANEOUS | Status: DC | PRN
Start: 2017-01-03 — End: 2017-01-05

## 2017-01-03 MED ORDER — HEPARIN SODIUM (PORCINE) 1000 UNIT/ML DIALYSIS
1000.0000 [IU] | INTRAMUSCULAR | Status: DC | PRN
  Filled 2017-01-03: qty 1

## 2017-01-03 MED ORDER — PROPOFOL 10 MG/ML IV BOLUS
INTRAVENOUS | Status: DC | PRN
  Administered 2017-01-03: 180 mg via INTRAVENOUS

## 2017-01-03 MED ORDER — EPHEDRINE 5 MG/ML INJ
INTRAVENOUS | Status: AC
  Filled 2017-01-03: qty 10

## 2017-01-03 MED ORDER — ONDANSETRON HCL 4 MG/2ML IJ SOLN
INTRAMUSCULAR | Status: DC | PRN
  Administered 2017-01-03: 4 mg via INTRAVENOUS

## 2017-01-03 MED ORDER — MIDAZOLAM HCL 5 MG/5ML IJ SOLN
INTRAMUSCULAR | Status: DC | PRN
  Administered 2017-01-03: 2 mg via INTRAVENOUS

## 2017-01-03 MED ORDER — SODIUM CHLORIDE 0.9 % IV SOLN: 100.0000 mL | INTRAVENOUS | Status: DC | PRN

## 2017-01-03 MED ORDER — HEPARIN SODIUM (PORCINE) 1000 UNIT/ML IJ SOLN
INTRAMUSCULAR | Status: DC | PRN
  Administered 2017-01-03: 3400 [IU] via INTRAVENOUS

## 2017-01-03 MED ORDER — PHENYLEPHRINE 40 MCG/ML (10ML) SYRINGE FOR IV PUSH (FOR BLOOD PRESSURE SUPPORT)
PREFILLED_SYRINGE | INTRAVENOUS | Status: AC
  Filled 2017-01-03: qty 10

## 2017-01-03 MED ORDER — HEPARIN SODIUM (PORCINE) 5000 UNIT/ML IJ SOLN
INTRAMUSCULAR | Status: DC | PRN
  Administered 2017-01-03: 500 mL

## 2017-01-03 MED ORDER — LIDOCAINE HCL (PF) 1 % IJ SOLN
5.0000 mL | INTRAMUSCULAR | Status: DC | PRN
  Filled 2017-01-03: qty 5

## 2017-01-03 MED ORDER — FENTANYL CITRATE (PF) 100 MCG/2ML IJ SOLN
INTRAMUSCULAR | Status: DC | PRN
  Administered 2017-01-03 (×2): 50 ug via INTRAVENOUS
  Administered 2017-01-03: 100 ug via INTRAVENOUS

## 2017-01-03 MED ORDER — MUPIROCIN 2 % EX OINT
1.0000 "application " | TOPICAL_OINTMENT | Freq: Two times a day (BID) | CUTANEOUS | Status: AC
  Administered 2017-01-03 – 2017-01-07 (×7): 1 via NASAL
  Filled 2017-01-03 (×2): qty 22

## 2017-01-03 MED ORDER — FENTANYL CITRATE (PF) 250 MCG/5ML IJ SOLN
INTRAMUSCULAR | Status: AC
  Filled 2017-01-03: qty 5

## 2017-01-03 MED ORDER — LIDOCAINE-EPINEPHRINE (PF) 1 %-1:200000 IJ SOLN
INTRAMUSCULAR | Status: AC
  Filled 2017-01-03: qty 30

## 2017-01-03 MED ORDER — ONDANSETRON HCL 4 MG/2ML IJ SOLN
INTRAMUSCULAR | Status: AC
  Filled 2017-01-03: qty 2

## 2017-01-03 MED ORDER — EPHEDRINE SULFATE 50 MG/ML IJ SOLN
INTRAMUSCULAR | Status: DC | PRN
  Administered 2017-01-03: 15 mg via INTRAVENOUS
  Administered 2017-01-03 (×4): 10 mg via INTRAVENOUS
  Administered 2017-01-03: 15 mg via INTRAVENOUS

## 2017-01-03 MED ORDER — CEFUROXIME SODIUM 1.5 G IV SOLR
INTRAVENOUS | Status: AC
  Filled 2017-01-03: qty 1.5

## 2017-01-03 MED ORDER — OXYCODONE-ACETAMINOPHEN 5-325 MG PO TABS
1.0000 | ORAL_TABLET | ORAL | Status: DC | PRN
  Administered 2017-01-03 – 2017-01-06 (×4): 1 via ORAL
  Filled 2017-01-03 (×4): qty 1

## 2017-01-03 MED ORDER — PHENYLEPHRINE HCL 10 MG/ML IJ SOLN
INTRAVENOUS | Status: DC | PRN
  Administered 2017-01-03: 50 ug/min via INTRAVENOUS

## 2017-01-03 MED ORDER — IOPAMIDOL (ISOVUE-300) INJECTION 61%
INTRAVENOUS | Status: AC
  Filled 2017-01-03: qty 50

## 2017-01-03 MED ORDER — CEFAZOLIN SODIUM-DEXTROSE 2-3 GM-% IV SOLR
INTRAVENOUS | Status: DC | PRN
  Administered 2017-01-03: 2 g via INTRAVENOUS

## 2017-01-03 MED ORDER — ALTEPLASE 2 MG IJ SOLR: 2.0000 mg | Freq: Once | INTRAMUSCULAR | Status: DC | PRN

## 2017-01-03 MED ORDER — LIDOCAINE HCL (CARDIAC) 20 MG/ML IV SOLN
INTRAVENOUS | Status: AC
  Filled 2017-01-03: qty 5

## 2017-01-03 MED ORDER — LIDOCAINE-PRILOCAINE 2.5-2.5 % EX CREA
1.0000 "application " | TOPICAL_CREAM | CUTANEOUS | Status: DC | PRN
  Filled 2017-01-03: qty 5

## 2017-01-03 MED ORDER — 0.9 % SODIUM CHLORIDE (POUR BTL) OPTIME
TOPICAL | Status: DC | PRN
  Administered 2017-01-03: 1000 mL

## 2017-01-03 MED ORDER — PROPOFOL 10 MG/ML IV BOLUS
INTRAVENOUS | Status: AC
  Filled 2017-01-03: qty 20

## 2017-01-03 MED ORDER — LIDOCAINE HCL (CARDIAC) 20 MG/ML IV SOLN
INTRAVENOUS | Status: DC | PRN
  Administered 2017-01-03: 80 mg via INTRAVENOUS

## 2017-01-03 MED ORDER — SUCCINYLCHOLINE CHLORIDE 20 MG/ML IJ SOLN
INTRAMUSCULAR | Status: DC | PRN
  Administered 2017-01-03: 100 mg via INTRAVENOUS

## 2017-01-03 MED ORDER — EPHEDRINE 5 MG/ML INJ
INTRAVENOUS | Status: AC
  Filled 2017-01-03: qty 20

## 2017-01-03 SURGICAL SUPPLY — 50 items
ARMBAND PINK RESTRICT EXTREMIT (MISCELLANEOUS) ×8 IMPLANT
BAG DECANTER FOR FLEXI CONT (MISCELLANEOUS) ×4 IMPLANT
BIOPATCH RED 1 DISK 7.0 (GAUZE/BANDAGES/DRESSINGS) ×3 IMPLANT
BIOPATCH RED 1IN DISK 7.0MM (GAUZE/BANDAGES/DRESSINGS) ×1
CANISTER SUCT 3000ML PPV (MISCELLANEOUS) ×4 IMPLANT
CANNULA VESSEL 3MM 2 BLNT TIP (CANNULA) ×8 IMPLANT
CATH PALINDROME RT-P 15FX19CM (CATHETERS) IMPLANT
CATH PALINDROME RT-P 15FX23CM (CATHETERS) ×4 IMPLANT
CATH PALINDROME RT-P 15FX28CM (CATHETERS) IMPLANT
CATH PALINDROME RT-P 15FX55CM (CATHETERS) IMPLANT
CLIP LIGATING EXTRA MED SLVR (CLIP) ×4 IMPLANT
CLIP LIGATING EXTRA SM BLUE (MISCELLANEOUS) ×4 IMPLANT
COVER PROBE W GEL 5X96 (DRAPES) ×4 IMPLANT
COVER SURGICAL LIGHT HANDLE (MISCELLANEOUS) ×4 IMPLANT
DECANTER SPIKE VIAL GLASS SM (MISCELLANEOUS) ×4 IMPLANT
DERMABOND ADVANCED (GAUZE/BANDAGES/DRESSINGS) ×4
DERMABOND ADVANCED .7 DNX12 (GAUZE/BANDAGES/DRESSINGS) ×4 IMPLANT
DRAPE C-ARM 42X72 X-RAY (DRAPES) ×4 IMPLANT
DRAPE CHEST BREAST 15X10 FENES (DRAPES) ×4 IMPLANT
ELECT REM PT RETURN 9FT ADLT (ELECTROSURGICAL) ×4
ELECTRODE REM PT RTRN 9FT ADLT (ELECTROSURGICAL) ×2 IMPLANT
GLOVE SS BIOGEL STRL SZ 7.5 (GLOVE) ×2 IMPLANT
GLOVE SUPERSENSE BIOGEL SZ 7.5 (GLOVE) ×2
GOWN STRL REUS W/ TWL LRG LVL3 (GOWN DISPOSABLE) ×6 IMPLANT
GOWN STRL REUS W/TWL LRG LVL3 (GOWN DISPOSABLE) ×6
KIT BASIN OR (CUSTOM PROCEDURE TRAY) ×4 IMPLANT
KIT ROOM TURNOVER OR (KITS) ×4 IMPLANT
NEEDLE 18GX1X1/2 (RX/OR ONLY) (NEEDLE) ×8 IMPLANT
NEEDLE 22X1 1/2 (OR ONLY) (NEEDLE) IMPLANT
NEEDLE HYPO 25GX1X1/2 BEV (NEEDLE) ×4 IMPLANT
NS IRRIG 1000ML POUR BTL (IV SOLUTION) ×4 IMPLANT
PACK CV ACCESS (CUSTOM PROCEDURE TRAY) ×4 IMPLANT
PACK SURGICAL SETUP 50X90 (CUSTOM PROCEDURE TRAY) ×4 IMPLANT
PAD ARMBOARD 7.5X6 YLW CONV (MISCELLANEOUS) ×8 IMPLANT
SLEEVE SURGEON STRL (DRAPES) ×4 IMPLANT
SOAP 2 % CHG 4 OZ (WOUND CARE) ×4 IMPLANT
SUT ETHILON 3 0 PS 1 (SUTURE) ×4 IMPLANT
SUT PROLENE 6 0 CC (SUTURE) ×4 IMPLANT
SUT VIC AB 3-0 SH 27 (SUTURE) ×2
SUT VIC AB 3-0 SH 27X BRD (SUTURE) ×2 IMPLANT
SUT VICRYL 4-0 PS2 18IN ABS (SUTURE) ×8 IMPLANT
SYR 10ML LL (SYRINGE) ×8 IMPLANT
SYR 20CC LL (SYRINGE) ×3 IMPLANT
SYR 5ML LL (SYRINGE) ×8 IMPLANT
SYR CONTROL 10ML LL (SYRINGE) ×4 IMPLANT
SYRINGE 20CC LL (MISCELLANEOUS) ×4 IMPLANT
TOWEL OR 17X24 6PK STRL BLUE (TOWEL DISPOSABLE) ×4 IMPLANT
TOWEL OR 17X26 10 PK STRL BLUE (TOWEL DISPOSABLE) ×4 IMPLANT
UNDERPAD 30X30 (UNDERPADS AND DIAPERS) ×4 IMPLANT
WATER STERILE IRR 1000ML POUR (IV SOLUTION) ×4 IMPLANT

## 2017-01-03 NOTE — H&P (View-Only) (Signed)
Requested by:  Dr. Pearson Grippe (Nephrology)  Reason for consultation: New access   History of Present Illness   Edward Jordan is a 56 y.o. (1961/01/02) male who presents for evaluation for permanent access.  The patient is rogjt hand dominant.  The patient has not had previous access procedures.  Previous central venous cannulation procedures include: none.  The patient has never had a PPM placed.  This patient was found down for reported 5 days during which he was stuck in his bed unable to get up.  Reportedly initially the patient was resistant to proceeding with HD but as agreed to proceed at this point.  Past Medical History:  Diagnosis Date  . Hypertension     Past Surgical History:  Procedure Laterality Date  . NO PAST SURGERIES      Social History   Social History  . Marital status: Legally Separated    Spouse name: N/A  . Number of children: N/A  . Years of education: N/A   Occupational History  . Not on file.   Social History Main Topics  . Smoking status: Never Smoker  . Smokeless tobacco: Never Used  . Alcohol use Yes     Comment: ocassionally  . Drug use: No  . Sexual activity: No   Other Topics Concern  . Not on file   Social History Narrative  . No narrative on file   Family History: patient is unable to detail the medical history of his parents  Current Facility-Administered Medications  Medication Dose Route Frequency Provider Last Rate Last Dose  . acetaminophen (TYLENOL) tablet 650 mg  650 mg Oral Q6H PRN Guadalupe Dawn, MD       Or  . acetaminophen (TYLENOL) suppository 650 mg  650 mg Rectal Q6H PRN Guadalupe Dawn, MD      . aspirin EC tablet 81 mg  81 mg Oral Daily Guadalupe Dawn, MD   81 mg at 01/02/17 1013  . calcitRIOL (ROCALTROL) capsule 0.25 mcg  0.25 mcg Oral Daily Guadalupe Dawn, MD   0.25 mcg at 01/02/17 1012  . carvedilol (COREG) tablet 50 mg  50 mg Oral BID WC Forde Dandy, MD   50 mg at 01/02/17 1013  . heparin injection  5,000 Units  5,000 Units Subcutaneous Q8H Guadalupe Dawn, MD   5,000 Units at 01/02/17 [id number]  . hydrALAZINE (APRESOLINE) tablet 100 mg  100 mg Oral TID Forde Dandy, MD   100 mg at 01/02/17 1013  . isosorbide mononitrate (IMDUR) 24 hr tablet 60 mg  60 mg Oral Daily Forde Dandy, MD   60 mg at 01/02/17 1015  . ondansetron (ZOFRAN) tablet 4 mg  4 mg Oral Q6H PRN Guadalupe Dawn, MD       Or  . ondansetron [facility]) injection 4 mg  4 mg Intravenous Q6H PRN Guadalupe Dawn, MD        No Known Allergies  REVIEW OF SYSTEMS (negative unless checked):   Cardiac:  []  Chest pain or chest pressure? []  Shortness of breath upon activity? []  Shortness of breath when lying flat? []  Irregular heart rhythm?  Vascular:  []  Pain in calf, thigh, or hip brought on by walking? []  Pain in feet at night that wakes you up from your sleep? []  Blood clot in your veins? []  Leg swelling?  Pulmonary:  []  Oxygen at home? []  Productive cough? []  Wheezing?  Neurologic:  [x]  Sudden weakness in arms or legs? []  Sudden numbness in arms or  legs? []  Sudden onset of difficult speaking or slurred speech? []  Temporary loss of vision in one eye? []  Problems with dizziness?  Gastrointestinal:  []  Blood in stool? []  Vomited blood?  Genitourinary:  []  Burning when urinating? []  Blood in urine?  Psychiatric:  []  Major depression  Hematologic:  []  Bleeding problems? []  Problems with blood clotting?  Dermatologic:  [x]  Rashes or ulcers?  Constitutional:  []  Fever or chills?  Ear/Nose/Throat:  []  Change in hearing? []  Nose bleeds? []  Sore throat?  Musculoskeletal:  []  Back pain? []  Joint pain? []  Muscle pain?   Physical Examination     Vitals:   01/01/17 1600 01/01/17 2130 01/02/17 0615 01/02/17 1012  BP: 102/65 112/61 118/68 127/65  Pulse: (!) 58 65 68   Resp:   18   Temp:  98.8 F (37.1 C) 98.4 F (36.9 C)   TempSrc:  Oral Oral   SpO2:  100% 99%   Weight:   293 lb 6.9 oz (133.1 kg)    Height:       Body mass index is 39.8 kg/m.  General Alert, O x 3, Obese, NAD  Head [place]/AT,    Ear/Nose/ Throat Hearing grossly intact, nares without erythema or drainage, oropharynx without Erythema or Exudate, Mallampati score: 3,   Eyes PERRLA, EOMI,    Neck Supple, mid-line trachea,    Pulmonary Sym exp, good B air movt, CTA B  Cardiac RRR, Nl S1, S2, no Murmurs, No rubs, No S3,S4  Vascular Vessel Right Left  Radial Palpable Palpable  Brachial Palpable Palpable  Carotid Palpable, No Bruit Palpable, No Bruit  Aorta Not palpable N/A  Femoral Not palpable due to pannus Not palpable due to pannus  Popliteal Not palpable Not palpable  PT Faintly palpable Faintly palpable  DP Palpable Palpable    Gastro- intestinal soft, non-distended, non-tender to palpation, No guarding or rebound, no HSM, no masses, no CVAT B, No palpable prominent aortic pulse,    Musculo- skeletal M/S 5/5 throughout BUE, BLE 3/5, Extremities without ischemic changes  , Non-pitting edema present: B 1-2+ (increasing distally), No visible varicosities , No Lipodermatosclerosis present, chronic venous stasis skin changes B  Neurologic Cranial nerves 2-12 intact , Pain and light touch intact in extremities , Motor exam as listed above  Psychiatric Judgement intact, Mood & affect appropriate for pt's clinical situation  Dermatologic See M/S exam for extremity exam, No rashes otherwise noted  Lymphatic  Palpable lymph nodes: None     Non-invasive Vascular Imaging   BUE Vein Mapping  (Date: 01/02/2017):   Right Cephalic  Segment Diameter Depth Comment  1. Axilla 4.77mm 17.58mm   2. Mid upper arm 5.71mm 9.42mm branch  3. Above AC 4.61mm 7.69mm   4. In [facility] 4.4mm 3.81mm   5. Below AC 5.81mm 6.31mm branch  6. Mid forearm 3.32mm 5.61mm   7. Wrist 3.85mm mm    mm mm    mm mm    mm mm    Left Cephalic  Segment Diameter Depth Comment  1. Axilla 5.35mm 12.48mm   2. Mid upper arm 5.50mm 7.81mm  branch  3. Above AC 5.64mm 6.35mm   4. In AC 6.76mm mm   5. Below AC 5.39mm 4.30mm branch  6. Mid forearm 3.56mm 4.66mm   7. Wrist 3.2mm mm    mm mm    mm mm    mm mm     Laboratory   CBC CBC Latest Ref Rng & Units 01/02/2017 01/01/2017 12/31/2016  WBC 4.0 - 10.5 K/uL 9.9 11.6(H) 18.2(H)  Hemoglobin 13.0 - 17.0 g/dL 8.9(L) 8.9(L) 10.9(L)  Hematocrit 39.0 - 52.0 % 26.2(L) 26.5(L) 32.5(L)  Platelets 150 - 400 K/uL 120(L) 132(L) 133(L)    BMP BMP Latest Ref Rng & Units 01/02/2017 01/01/2017 12/31/2016  Glucose 65 - 99 mg/dL 112(H) 106(H) 144(H)  BUN 6 - 20 mg/dL 205(H) 195(H) 187(H)  Creatinine 0.61 - 1.24 mg/dL 12.47(H) 11.87(H) 11.61(H)  Sodium 135 - 145 mmol/L 140 139 140  Potassium 3.5 - 5.1 mmol/L 4.7 4.6 4.5  Chloride 101 - 111 mmol/L 112(H) 112(H) 113(H)  CO2 22 - 32 mmol/L 9(L) 11(L) 13(L)  Calcium 8.9 - 10.3 mg/dL 6.9(L) 7.4(L) 7.4(L)    Coagulation No results found for: INR, PROTIME No results found for: PTT  Lipids    Component Value Date/Time   CHOL 118 04/20/2016 0114   TRIG 50 04/20/2016 0114   HDL 29 (L) 04/20/2016 0114   CHOLHDL 4.1 04/20/2016 0114   VLDL 10 04/20/2016 0114   [place] 79 04/20/2016 0114    Medical Decision Making   Edward Jordan is a 56 y.o. male who presents with end stage renal disease requiring hemodialysis, uremia, resolving hypernatremia, HTN, morbid obesity  Based on vein mapping and examination, this patient's permanent access options include: bilateral radiocephalic vs brachiocephalic arteriovenous fistula.  I would start with left radiocephalic vs brachiocephalic arteriovenous fistula, tunneled dialysis placement.I had an extensive discussion with this patient in regards to the nature of access surgery, including risk, benefits, and alternatives.    The patient is aware that the risks of access surgery include but are not limited to: bleeding, infection, steal syndrome, nerve damage, ischemic monomelic neuropathy, failure  of access to mature, complications related to venous hypertension, and possible need for additional access procedures in the future. The patient is aware the risks of tunneled dialysis catheter placement include but are not limited to: bleeding, infection, central venous injury, pneumothorax, possible venous stenosis, possible malpositioning in the venous system, and possible infections related to long-term catheter presence.  The patient was aware of these risks and agreed to proceed. Will add patient onto Dr. Freddie Breech schedule for tomorrow depending on surgeon and room availability.   Adele Barthel, MD, FACS [facility] Office: [phone] Pager: [phone]  01/02/2017, 2:39 PM

## 2017-01-03 NOTE — Interval H&P Note (Signed)
History and Physical Interval Note:  01/03/2017 11:25 AM  Edward Jordan  has presented today for surgery, with the diagnosis of ESRD  The various methods of treatment have been discussed with the patient and family. After consideration of risks, benefits and other options for treatment, the patient has consented to  Procedure(s): INSERTION OF DIALYSIS CATHETER (N/A) ARTERIOVENOUS (AV) FISTULA CREATION (Left) as a surgical intervention .  The patient's history has been reviewed, patient examined, no change in status, stable for surgery.  I have reviewed the patient's chart and labs.  Questions were answered to the patient's satisfaction.     Curt Jews

## 2017-01-03 NOTE — Progress Notes (Signed)
Admit: 12/30/2016 LOS: 3  56 yo M with progressive CKD5, now ESRD. Admitted 7/19 with severe dehydration and hypernatremia after being found down x 5 days covered in urine and feces. Improved with IVFs. Initially declined HD, however now willing to proceed.   Subjective:  Feeling well the AM, no complaints. NPO since MN. Planning for AV fistula and TDC placement today per VVS.   07/22 0701 - 07/23 0700 In: -  Out: 1075 [VJKQA:0601]  Filed Weights   01/01/17 0446 01/02/17 0615 01/03/17 0503  Weight: (!) 301 lb 5.9 oz (136.7 kg) 293 lb 6.9 oz (133.1 kg) 296 lb 11.8 oz (134.6 kg)    Scheduled Meds: . aspirin EC  81 mg Oral Daily  . calcitRIOL  0.25 mcg Oral Daily  . carvedilol  50 mg Oral BID WC  . heparin  5,000 Units Subcutaneous Q8H  . hydrALAZINE  100 mg Oral TID  . isosorbide mononitrate  60 mg Oral Daily   Continuous Infusions: PRN Meds:.acetaminophen **OR** acetaminophen, ondansetron **OR** ondansetron (ZOFRAN) IV  Current Labs: reviewed    Physical Exam:  Blood pressure 111/67, pulse 64, temperature 98.4 F (36.9 C), resp. rate 18, height 6' (1.829 m), weight 296 lb 11.8 oz (134.6 kg), SpO2 100 %. GEN: Obese, NAD CV: RRR PULM: CTAB ABD: Soft, non tender, normal BS EXT: No edema, no asterixis   A 1. ESRD: Planning for AV fistula and TDC placement today per VVS. Patient remains asymptomatic, no signs or sxs of uremia. Electrolytes stable. BUN/SCr continue to rise. 2. Hypernatremia: Resolved with IVFs 3. HTN: Well controlled now, on coreg, hydralazine, and imdur   P  1. Plan for dialysis today after access is established, HD orders placed.  2. Daily labs, strict I/Os, daily weights, avoid nephrotoxins.  3. Other issues per primary.   Velna Ochs, M.D. - PGY2 Pager: [phone] 01/03/2017, 11:18 AM    Recent Labs Lab 01/01/17 4327 01/02/17 0544 01/02/17 1233 01/03/17 0229  NA 139 140  --  137  K 4.6 4.7  --  4.8  CL 112* 112*  --  111  CO2 11* 9*  --   10*  GLUCOSE 106* 112*  --  112*  BUN 195* 205*  --  211*  CREATININE 11.87* 12.47*  --  12.73*  CALCIUM 7.4* 6.9*  --  6.6*  PHOS  --   --  7.8*  --     Recent Labs Lab 12/30/16 2145 12/31/16 0340 01/01/17 0652 01/02/17 0544  WBC 19.1* 18.2* 11.6* 9.9  NEUTROABS 17.5*  --   --   --   HGB 11.9* 10.9* 8.9* 8.9*  HCT 35.7* 32.5* 26.5* 26.2*  MCV 81.7 81.3 80.3 79.9  PLT 157 133* 132* 120*

## 2017-01-03 NOTE — Op Note (Signed)
    OPERATIVE REPORT  DATE OF SURGERY: 01/03/2017  PATIENT: Edward Jordan, 56 y.o. male MRN: [id number]  DOB: Oct 13, 1960  PRE-OPERATIVE DIAGNOSIS: End-stage renal disease  POST-OPERATIVE DIAGNOSIS:  Same  PROCEDURE: #1 right IJ tunneled catheter with SonoSite visualization, #2 left radiocephalic AV fistula  SURGEON:  Curt Jews, M.D.  PHYSICIAN ASSISTANT: Nurse  ANESTHESIA:  Gen.  EBL: Minimal ml  Total I/O In: -  Out: 300 [Urine:300]  BLOOD ADMINISTERED: None  DRAINS: None  SPECIMEN: None  COUNTS CORRECT:  YES  PLAN OF CARE: PACU   PATIENT DISPOSITION:  PACU - hemodynamically stable  PROCEDURE DETAILS: Patient was taken to room placed supine position where the area of the right and left neck were imaged with ultrasound revealing widely patent jugular veins bilaterally. The placement was placed in Trendelenburg position and the right and left neck and chest prepped and draped in usual sterile fashion. 18-gauge needle was used to access the right internal jugular vein and the guidewire was passed down the level of right atrium and this was confirmed with fluoroscopy. A dilator and peel-away sheath was passed over the guidewire and the dilator and guidewire removed. A 23 cm hemodialysis catheter was passed through the peel-away sheath and the peel-away sheath was removed. The tips were placed in the level of the distal right atrium. The catheter was brought through subcutaneous tunnel through a separate stab incision in the 2 lm ports were attached. Both lumens flushed and aspirated easily and were locked with 1000 unit per cc heparin. The catheter was secured to the skin with a 3-0 nylon stitch and the entry site was closed with a 4 subcuticular Vicryl stitch.  Attention was then turned to the left arm. This was imaged with SonoSite ultrasound the patient had a good caliber cephalic vein throughout its course. The patient's blood pressure was running in the 62-69 systolic throughout  the case. Patient had a good radial pulse and holding area and diminished at the time of surgery. The surgeon made between the level of the cephalic vein and radial artery at wrist. The cephalic vein was of good caliber curvature branches were ligated with 3 or 4 silk ties and divided. The vein was ligated distally and divided heparinized saline was of good caliber. The radial artery was exposed the same incision. Had no evidence of atherosclerotic change. The artery was occluded proximally and distally with Serafin clamp and was opened with an 11 blade and extended legitimate Potts scissors. A 2 dilator passed easily through the artery. The appropriate length and was spatulated and sewn end-to-side to the artery with a running 6-0 Prolene suture. Clamps removed and good flow was noted through the cephalic vein. Wounds irrigated with saline. Hemostasis tablet cautery. Wounds were closed with 3-0 Vicryl in the subcutaneous and subsequently tissue. Sterile dressing was applied the patient was taken to the recovery room where chest x-ray is pending   Rosetta Posner, M.D., [facility] 01/03/2017 2:49 PM

## 2017-01-03 NOTE — Progress Notes (Signed)
OT Cancellation Note  Patient Details Name: Edward Jordan MRN: [id number] DOB: 1960-07-21   Cancelled Treatment:    Reason Eval/Treat Not Completed: Patient at procedure or test/ unavailable. Pt in surgery for permanent HD access. Will check back as able.  Norman Herrlich, MS OTR/L  Pager: [phone]   Norman Herrlich 01/03/2017, 11:31 AM

## 2017-01-03 NOTE — Anesthesia Procedure Notes (Signed)
Procedure Name: Intubation Date/Time: 01/03/2017 1:00 PM Performed by: Greggory Stallion, Ever Gustafson L Pre-anesthesia Checklist: Patient identified, Emergency Drugs available, Suction available and Patient being monitored Patient Re-evaluated:Patient Re-evaluated prior to induction Oxygen Delivery Method: Circle System Utilized Preoxygenation: Pre-oxygenation with 100% oxygen Induction Type: IV induction and Cricoid Pressure applied Ventilation: Mask ventilation without difficulty Laryngoscope Size: Mac and 4 Grade View: Grade III Tube type: Oral Tube size: 8.0 mm Number of attempts: 1 Airway Equipment and Method: Stylet and Oral airway Placement Confirmation: ETT inserted through vocal cords under direct vision,  positive ETCO2 and breath sounds checked- equal and bilateral Secured at: 23 cm Tube secured with: Tape Dental Injury: Teeth and Oropharynx as per pre-operative assessment

## 2017-01-03 NOTE — Progress Notes (Signed)
PT Cancellation Note  Patient Details Name: Burnis Halling MRN: [id number] DOB: 10/28/1960   Cancelled Treatment:    Reason Eval/Treat Not Completed: Patient at procedure or test/unavailable.  Pt is surgery for permanent HD access.  Will see later as able. 01/03/2017  Donnella Sham, PT [phone]  (pager)   Tessie Fass Joselyne Spake 01/03/2017, 11:12 AM

## 2017-01-03 NOTE — Progress Notes (Signed)
Family Medicine Teaching Service Daily Progress Note Intern Pager: [phone]  Patient name: Edward Jordan Medical record number: [id number] Date of birth: 1960-06-28 Age: 56 y.o. Gender: male  Primary Care Provider: Patient, No Pcp Per Consultants: Nephrology, Psychiatry  Code Status: Full   Pt Overview and Major Events to Date:  Edward Jordan is a 56 y.o. Male presenting with hypernatremia, dehydration, and AKI 2/2 prolonged immobility for 5 days. Patient was admitted to [facility] on 12/31/2016. HD on 01/03/2017.   Assessment and Plan: Edward Jordan a 56 y.o.malepresenting with 5 day history of poor po intake. On admission found to be hypernatremia, dehydrated, and AKI. PMH is significant for HTN, CKD, HLD, morbid obesity, HFrEF    Dehydration Improved.  Patient with no po intake prior to admission in setting of known CKD.  Cr was significantly elevated on admission along with BUN, however pt continues to make urine and without signs of uremic encephalopathy or pericarditis.  Has history of HFrEF 35% per chart review in 2017, will need to monitor fluid status very carefully. Patient initially refused evaluation for dialysis. Was seen by psychiatry to evaluate for ability to make own, informed decisions. After he was found to be competent, nephrology convinced him of need for dialysis. Patient underwent permcath placement along with fistula creation on 01/03/2017. Patient with bun 164, and creatinine 10.58 in am of 7/24. Patient today denies discomfort, but states he feels similar to before HD. Vascular surgery has seen patient today. Vascular surgery will not follow actively in the hospital and will see him in the office in one month for office follow-up and duplex of his fistula. - appreciate vascular surgery recommendations - dialysis per nephrology, appreciate recommendations  - Monitor volume status closely - Daily weights, Strict I/Os  - daily bmp  Hyperuremia Improving with HD dialysis. Patient with  very elevated bun during admission. Bun 199 on admission. Increased to 211 on BMP on 7/23 prior to HD. Current BUN 164 following HD. Patient received dialysis access on 7/23, will need HD now that access has been created. Patient with no s/s of encephalopathy and cardiomyopathy - Dialysis per nephrology, appreciate their recs - monitor for s/s encephalopathy, cardiomyopathy  Hypoalbuminemia  Current albumin of 2.5. Albumin of 3.3 on admission. Likely due to protein calorie malnutrition from poor po intake prior to admission. Less likely causes include protein losing nephrotic syndrome and liver cirrhosis.  -Will need to maintain adequate protein for wound healing.  -continue to monitor  -appreciate nutrition recommendations   Hypernatremia-resolved Resolved upon rehydration. Na Currently 138. On admission with Na of 157.  Prior to admission had little to no po intake for 5 days. -daily BMP -Nephrology recs  -monitor fluid status  Hypertension Resolved.  BP this AM 147/77.  Had not been taking home meds for last 4-5 days. BP on admission elevated to 174/97. Holding home lasix as patient is in acute renal failure 2/2 dehydration. - vitals per floor routine  - continue coreg 50mg  bid  - continue hydralazine 100mg  tid  - continue imdur 60mg  daily   HFrEF Echo from 2017 showing EF 35-40%. Will need to monitor EKG. Monitor for any s/s of fluid overload given necessity for iv hydration 2/2 dehydration. If develops acute shortness of breath or ekg with concerning changes, will possibly need re-evaluation.  - continue coreg - monitor cardiac status - monitor EKG - continue to monitor daily weights - I's and O's  Skin Breakdown Patient soiled in own urine and feces for  4 days. Areas of irritation in perineum and medial thighs. Patient denies tenderness or irritation to area, and states nursing care has improved breakdown. Area clean and dry on inspection. Will need to follow closely, no  concern at this time for violation of dermis.No signs of Fournier gangrene on exam. -continue to monitor   Morbid obesity Patient apparently unable to get up after bed broke for 4-5 days. Will have pt/ot assess function status while here. Pt likely has sedentary lifestyle but concerning due to pts limited hx of social and living situation. OT recommendation of SNF vs Home Health at discharge. PT recommendation of home health and rolling walker with 5" wheels 3-in-1 - appreciate PT recommendations   - appreciate OT recommendations   Hyperlipidemia Listed in problem list. Lipid panel from 04/2016 grossly normal aside from low hdl. Doesn't take any medication.  -Will likely need to follow this up as an outpatient.    Hyperglycemia A1c on this admission 5.3.  Patient with elevated glucose upon admission of 140. Current glucose 102. A1c in November 5.6.  - F/U blood glucose on daily bmp   FEN/GI: heart healthy, carb modified PPx: SCDs, heparin   Disposition: improving   Subjective:  Patient today states he feels the same as yesterday prior to HD. Patient denies pain, chest pain, shortness of breath, nausea, vomiting, dizziness, or lightheadedness. Patient states he is tolerating diet well. Patient states feet are sore but that is a chronic problem.   Objective: Temp:  [97.3 F (36.3 C)-98 F (36.7 C)] 97.4 F (36.3 C) (07/24 0545) Pulse Rate:  [54-64] 64 (07/24 1044) Resp:  [10-18] 18 (07/24 0545) BP: (102-147)/(54-88) 128/67 (07/24 1044) SpO2:  [98 %-100 %] 100 % (07/24 0545) Weight:  [289 lb 14.5 oz (131.5 kg)-294 lb 5 oz (133.5 kg)] 289 lb 14.5 oz (131.5 kg) (07/24 0501) Physical Exam: General: awake and alert and oriented. Patient is lying in bed, resting comfortably  Cardiovascular: RRR, no MRG Respiratory: CTAB, no wheezes, rales, or rhonchi  Abdomen: soft, non tender, non distended, bowel sounds x 4 Extremities: no edema, tenderness to soles of feet bilaterally Derm: HD  access site shows no signs of infection. Skin irritation shows no signs of Fournier gangrene. Area of skin breakdown clean and dry, with no tenderness.   Laboratory:  Recent Labs Lab 01/02/17 0544 01/03/17 1830 01/04/17 0300  WBC 9.9 10.5 6.7  HGB 8.9* 8.8* 8.9*  HCT 26.2* 26.3* 26.8*  PLT 120* 133* 134*    Recent Labs Lab 12/30/16 2145  01/03/17 1830 01/04/17 0246 01/04/17 0619  NA 157*  < > 138 137 138  K 5.1  < > 5.5* 5.5* 4.3  CL 126*  < > 113* 110 109  CO2 11*  < > 10* 11* 14*  BUN 199*  < > 212* 212* 164*  CREATININE 13.24*  < > 13.09* 13.16* 10.58*  CALCIUM 8.3*  < > 7.2* 7.2* 7.2*  PROT 7.9  --   --   --   --   BILITOT 0.9  --   --   --   --   ALKPHOS 57  --   --   --   --   ALT 12*  --   --  14*  --   AST 14*  --   --   --   --   GLUCOSE 139*  < > 127* 121* 102*  < > = values in this interval not displayed.   Imaging/Diagnostic Tests:  Dg Chest Port 1 View  Result Date: 01/03/2017 CLINICAL DATA:  56 year old male status post dialysis catheter placement EXAM: PORTABLE CHEST 1 VIEW COMPARISON:  Prior chest x-ray 04/19/2016 FINDINGS: Right IJ approach tunneled hemodialysis catheter. The catheter tip projects over the upper right atrium. Stable cardiomegaly and mild pulmonary vascular congestion without overt edema. Inspiratory volumes remain low. Probable small chronic right pleural effusion. No evidence pneumothorax. No acute osseous abnormality. IMPRESSION: 1. The tip of a right IJ approach tunneled hemodialysis catheter overlies the upper right atrium. 2. No evidence pneumothorax. 3. Stable cardiomegaly and vascular congestion without overt edema. Electronically Signed   By: Jacqulynn Cadet M.D.   On: 01/03/2017 15:37   Dg Fluoro Guide Cv Line-no Report  Result Date: 01/03/2017 Fluoroscopy was utilized by the requesting physician.  No radiographic interpretation.     Caroline More, DO 01/04/2017, 10:52 AM PGY-1, [place] Intern  pager: [phone], text pages welcome

## 2017-01-03 NOTE — Transfer of Care (Signed)
Immediate Anesthesia Transfer of Care Note  Patient: Carney Corners  Procedure(s) Performed: Procedure(s) with comments: INSERTION OF DIALYSIS CATHETER Right Interanl Jugular Placement (Right) - Right Interanl Jugular Placement ARTERIOVENOUS (AV) FISTULA CREATION (Left)  Patient Location: PACU  Anesthesia Type:General  Level of Consciousness: sedated, drowsy and responds to stimulation  Airway & Oxygen Therapy: Patient Spontanous Breathing and Patient connected to nasal cannula oxygen  Post-op Assessment: Report given to RN, Post -op Vital signs reviewed and stable and Patient moving all extremities  Post vital signs: Reviewed and stable  Last Vitals:  Vitals:   01/02/17 2202 01/03/17 0503  BP: 116/67 111/67  Pulse: 65 64  Resp: 19 18  Temp: 36.8 C 36.9 C    Last Pain:  Vitals:   01/03/17 0828  TempSrc:   PainSc: 6       Patients Stated Pain Goal: 5 (36/85/99 2341)  Complications: No apparent anesthesia complications

## 2017-01-03 NOTE — Progress Notes (Signed)
Family Medicine Teaching Service Daily Progress Note Intern Pager: [phone]  Patient name: Edward Jordan Medical record number: [id number] Date of birth: Dec 03, 1960 Age: 56 y.o. Gender: male  Primary Care Provider: Patient, No Pcp Per Consultants: Nephrology  Code Status: Full   Pt Overview and Major Events to Date:  Edward Jordan is a 56 y.o. Male presenting with hypernatremia, dehydration, and AKI 2/2 prolonged immobility for 5 days. Patient was admitted to [facility] on 12/31/2016.  Assessment and Plan: Edward Jordan is a 56 y.o. male presenting with 5 day history of poor po intake. On admission found to be hypernatremia, dehydrated, and AKI. PMH is significant for htn, ckd, hld, morbid obesity, HFrEF    Dehydration Improved.  Patient with no po intake in setting of known CKD.  Cr is significantly elevated along with BUN, however pt continues to make urine and without signs of uremic encephalopathy or pericarditis.  Has history of HFrEF 35% per chart review in 2017 so will need to monitor fluid status very carefully. Patient initially refused evaluation for dialysis. Was seen by psychiatry to evaluate for ability to make own, informed decisions. After he was found to be competent, nephrology convinced him of need for dialysis. Patient to get permcath placement along with fistula creation this am. Patient with bun 211, and creatinine 12.72 in am of 7/23. - fistula creation and permcath placement per vascular surgery, appreciate their recs - After access is gained, dialysis per nephrology, appreciate their recs  - Monitor volume status closely - Daily weights, Strict I/Os  - daily bmp  Hyperuremia Patient with very elevated bun during admission. Bun 199 on admission. UP to 211 on BMP today. Will need dialysis access, which patient is now amenable to. Will need dialysis when gets access. Patient with no s/s of encephalopathy and cardiomyopathy - Dialysis per nephrology appreciate their recs - monitor for s/s  encephalopathy, cardiomyopathy  Hypernatremia Resolved upon rehydration. Na Currently 137. On admission with Na of 157.  Prior to admission Has had little to no po intake for 5 days. -AM BMET   -Nephrology recs  -monitor fluid status  Hypertension Resolved.  BP this AM 111/67.  Had not been taking home meds for last 4-5 days. BP on admission elevated to 174/97. Holding home lasix as patient is in acute renal failure 2/2 dehydration. - vitals per floor routine  - continue coreg 50mg  bid  - continue hydralazine 100mg  tid  - continue imdur 60mg  daily   HFrEF Echo from 2017 showing EF 35-40%. Will need to monitor EKG. Monitor for any s/s of fluid overload given necessity for iv hydration 2/2 dehydration. If develops acute shortness of breath or ekg with concerning changes, will possibly need re-evaluation.  - continue coreg - monitor cardiac status  Skin Breakdown Patient soiled in own urine and feces for 4 days. Areas of irritation in perineum and medial thighs. Tender to touch but area clean and dry on inspection. Will need to follow closely, no concern at this time for violation of dermis. No signs of Fournier gangrene on exam.   Morbid obesity Patient apparently unable to get up after bed broke for 4-5 days. Will have pt/ot assess function status while here. Pt likely has sedentary lifestyle but concerning due to pts limited hx of social and living situation. - PT - recommended HH PT and rolling walker with 5" wheels 3-in-1 - OT recs  Hyperlipidemia Listed in problem list. Lipid panel from 04/2016 grossly normal aside from low hdl. Doesn't  take any medication. Will likely need to follow this up as an outpatient.    Hyperglycemia A1c on this admission 5.3.  Patient with elevated glucose upon admission of 140.  Current glucose 112. A1c in November 5.6.  - F/U blood glucose on daily bmp   FEN/GI: heart healthy, carb modified  PPx: SCDs, heparin   Disposition: continue to  monitor   Subjective:  Patient with no complaints this am. Possibly going down for av fistula creation/Permcath this am.   Objective: Temp:  [98 F (36.7 C)-98.4 F (36.9 C)] 98.4 F (36.9 C) (07/23 0503) Pulse Rate:  [63-65] 64 (07/23 0503) Resp:  [18-20] 18 (07/23 0503) BP: (111-134)/(65-77) 111/67 (07/23 0503) SpO2:  [99 %-100 %] 100 % (07/23 0503) Weight:  [296 lb 11.8 oz (134.6 kg)] 296 lb 11.8 oz (134.6 kg) (07/23 0503)   Physical Exam: General: 56 yo M, alert, in no acute distress  Cardiovascular: RRR, no MRG Respiratory: CTAB, no wheezes  Abdomen: soft, NTND, +bs  Extremities: no swelling noted, SCDs in place GU: irritation to bilateral medial thighs improving Psych: mood improved this am  Laboratory:  Recent Labs Lab 12/31/16 0340 01/01/17 0652 01/02/17 0544  WBC 18.2* 11.6* 9.9  HGB 10.9* 8.9* 8.9*  HCT 32.5* 26.5* 26.2*  PLT 133* 132* 120*    Recent Labs Lab 12/30/16 2145  01/01/17 0652 01/02/17 0544 01/03/17 0229  NA 157*  < > 139 140 137  K 5.1  < > 4.6 4.7 4.8  CL 126*  < > 112* 112* 111  CO2 11*  < > 11* 9* 10*  BUN 199*  < > 195* 205* 211*  CREATININE 13.24*  < > 11.87* 12.47* 12.73*  CALCIUM 8.3*  < > 7.4* 6.9* 6.6*  PROT 7.9  --   --   --   --   BILITOT 0.9  --   --   --   --   ALKPHOS 57  --   --   --   --   ALT 12*  --   --   --   --   AST 14*  --   --   --   --   GLUCOSE 139*  < > 106* 112* 112*  < > = values in this interval not displayed.  Imaging/Diagnostic Tests: No results found.   Guadalupe Dawn, MD 01/03/2017, 8:58 AM PGY-1, [place] Intern pager: [phone], text pages welcome

## 2017-01-03 NOTE — Plan of Care (Signed)
Problem: Safety: Goal: Ability to remain free from injury will improve Outcome: Progressing Pt will remain free from falls and injuries during this hospitalization.   

## 2017-01-03 NOTE — Anesthesia Preprocedure Evaluation (Signed)
Anesthesia Evaluation  Patient identified by MRN, date of birth, ID band Patient awake  General Assessment Comment:Poor knowledge of condtion and planned surgery  Reviewed: Allergy & Precautions, NPO status , Patient's Chart, lab work & pertinent test results  Airway Mallampati: II   Neck ROM: Full    Dental no notable dental hx.    Pulmonary    breath sounds clear to auscultation       Cardiovascular hypertension, +CHF   Rhythm:Regular Rate:Normal     Neuro/Psych negative neurological ROS     GI/Hepatic negative GI ROS,   Endo/Other  Morbid obesity  Renal/GU CRFRenal disease     Musculoskeletal   Abdominal (+) + obese,   Peds  Hematology  (+) Blood dyscrasia, ,   Anesthesia Other Findings   Reproductive/Obstetrics                             Anesthesia Physical Anesthesia Plan  ASA: IV  Anesthesia Plan: General   Post-op Pain Management:    Induction: Intravenous  PONV Risk Score and Plan: 3 and Ondansetron, Dexamethasone, Propofol and Midazolam  Airway Management Planned: Oral ETT  Additional Equipment:   Intra-op Plan:   Post-operative Plan: Extubation in OR  Informed Consent: I have reviewed the patients History and Physical, chart, labs and discussed the procedure including the risks, benefits and alternatives for the proposed anesthesia with the patient or authorized representative who has indicated his/her understanding and acceptance.   Dental advisory given  Plan Discussed with: CRNA  Anesthesia Plan Comments:         Anesthesia Quick Evaluation

## 2017-01-03 NOTE — Progress Notes (Signed)
No surgical orders.

## 2017-01-04 ENCOUNTER — Other Ambulatory Visit: Payer: Self-pay

## 2017-01-04 ENCOUNTER — Telehealth: Payer: Self-pay | Admitting: Vascular Surgery

## 2017-01-04 ENCOUNTER — Encounter (HOSPITAL_COMMUNITY): Payer: Self-pay | Admitting: Vascular Surgery

## 2017-01-04 LAB — BASIC METABOLIC PANEL
ANION GAP: 15 (ref 5–15)
Anion gap: 16 — ABNORMAL HIGH (ref 5–15)
BUN: 212 mg/dL — AB (ref 6–20)
BUN: 164 mg/dL — ABNORMAL HIGH (ref 6–20)
CHLORIDE: 110 mmol/L (ref 101–111)
CO2: 11 mmol/L — ABNORMAL LOW (ref 22–32)
CO2: 14 mmol/L — ABNORMAL LOW (ref 22–32)
Calcium: 7.2 mg/dL — ABNORMAL LOW (ref 8.9–10.3)
Calcium: 7.2 mg/dL — ABNORMAL LOW (ref 8.9–10.3)
Chloride: 109 mmol/L (ref 101–111)
Creatinine, Ser: 13.16 mg/dL — ABNORMAL HIGH (ref 0.61–1.24)
Creatinine, Ser: 10.58 mg/dL — ABNORMAL HIGH (ref 0.61–1.24)
GFR calc Af Amer: 4 mL/min — ABNORMAL LOW (ref >60)
GFR calc Af Amer: 6 mL/min — ABNORMAL LOW (ref >60)
GFR calc non Af Amer: 4 mL/min — ABNORMAL LOW (ref >60)
GFR, EST NON AFRICAN AMERICAN: 5 mL/min — AB (ref >60)
GLUCOSE: 121 mg/dL — AB (ref 65–99)
GLUCOSE: 102 mg/dL — AB (ref 65–99)
POTASSIUM: 5.5 mmol/L — AB (ref 3.5–5.1)
POTASSIUM: 4.3 mmol/L (ref 3.5–5.1)
SODIUM: 137 mmol/L (ref 135–145)
Sodium: 138 mmol/L (ref 135–145)

## 2017-01-04 LAB — CBC
HCT: 26.8 % — ABNORMAL LOW (ref 39.0–52.0)
Hemoglobin: 8.9 g/dL — ABNORMAL LOW (ref 13.0–17.0)
MCH: 26.9 pg (ref 26.0–34.0)
MCHC: 33.2 g/dL (ref 30.0–36.0)
MCV: 81 fL (ref 78.0–100.0)
PLATELETS: 134 10*3/uL — AB (ref 150–400)
RBC: 3.31 MIL/uL — AB (ref 4.22–5.81)
RDW: 17 % — ABNORMAL HIGH (ref 11.5–15.5)
WBC: 6.7 10*3/uL (ref 4.0–10.5)

## 2017-01-04 LAB — ALT: ALT: 14 U/L — ABNORMAL LOW (ref 17–63)

## 2017-01-04 NOTE — Telephone Encounter (Signed)
-----   Message from Mena Goes, RN sent at 01/03/2017  3:37 PM EDT ----- Regarding: 4-6 weeks w/ duplex   ----- Message ----- From: Alvia Grove, PA-C Sent: 01/03/2017   2:41 PM To: [facility] Charge Pool  S/p left radial cephalic AVF 5/63/14  F/u with Dr. Donnetta Hutching in 4-6 weeks with duplex  Thanks Maudie Mercury

## 2017-01-04 NOTE — Progress Notes (Signed)
Admit: 12/30/2016 LOS: 4  56 yo M with progressive CKD5, now ESRD. Admitted 7/19 with severe dehydration and hypernatremia after being found down x 5 days covered in urine and feces. Improved with IVFs. Initially declined HD, however now willing to proceed.   Subjective:  S/p right IJ TDC and left radiocephalic AVF per [facility]. Intermittent HD initiated early this AM, tolerated without complication.  07/23 0701 - 07/24 0700 In: 350 [I.V.:350] Out: 2530 [Urine:500; Blood:30]  Filed Weights   01/03/17 0503 01/04/17 0240 01/04/17 0501  Weight: 296 lb 11.8 oz (134.6 kg) 294 lb 5 oz (133.5 kg) 289 lb 14.5 oz (131.5 kg)    Scheduled Meds: . aspirin EC  81 mg Oral Daily  . calcitRIOL  0.25 mcg Oral Daily  . carvedilol  50 mg Oral BID WC  . Chlorhexidine Gluconate Cloth  6 each Topical Daily  . heparin  5,000 Units Subcutaneous Q8H  . hydrALAZINE  100 mg Oral TID  . isosorbide mononitrate  60 mg Oral Daily  . mupirocin ointment  1 application Nasal BID   Continuous Infusions: . sodium chloride    . sodium chloride    . sodium chloride     PRN Meds:.sodium chloride, sodium chloride, acetaminophen **OR** acetaminophen, alteplase, heparin, lidocaine (PF), lidocaine-prilocaine, ondansetron **OR** ondansetron (ZOFRAN) IV, oxyCODONE-acetaminophen, pentafluoroprop-tetrafluoroeth  Current Labs: reviewed    Physical Exam:  Blood pressure (!) 147/77, pulse 64, temperature (!) 97.4 F (36.3 C), temperature source Oral, resp. rate 18, height 6' (1.829 m), weight 289 lb 14.5 oz (131.5 kg), SpO2 100 %. GEN: Obese, NAD CV: RRR PULM: CTAB ABD: Soft, non tender, normal BS EXT: No edema, no asterixis   A 1. ESRD: S/p right IJ TDC and left radiocephalic AVF per [facility] on 5/05. Intermittent HD initiated early this morning, tolerated without complication.  2. Hypernatremia: Resolved with IVFs 3. HTN: Well controlled now, on coreg, hydralazine, and imdur   P  1. Plan for dialysis again today, Dr. Justin Mend  to place HD orders.  Will need to be set up with outpatient HD prior to discharge.  2. Daily labs, strict I/Os, daily weights, avoid nephrotoxins.  3. Other issues per primary.   Velna Ochs, M.D. - PGY2 Pager: [phone] 01/04/2017, 7:55 AM    Recent Labs Lab 01/02/17 1233  01/03/17 1830 01/04/17 0246 01/04/17 0619  NA  --   < > 138 137 138  K  --   < > 5.5* 5.5* PENDING  CL  --   < > 113* 110 109  CO2  --   < > 10* 11* 14*  GLUCOSE  --   < > 127* 121* 102*  BUN  --   < > 212* 212* 164*  CREATININE  --   < > 13.09* 13.16* 10.58*  CALCIUM  --   < > 7.2* 7.2* 7.2*  PHOS 7.8*  --  9.6*  --   --   < > = values in this interval not displayed.  Recent Labs Lab 12/30/16 2145  01/02/17 0544 01/03/17 1830 01/04/17 0300  WBC 19.1*  < > 9.9 10.5 6.7  NEUTROABS 17.5*  --   --   --   --   HGB 11.9*  < > 8.9* 8.8* 8.9*  HCT 35.7*  < > 26.2* 26.3* 26.8*  MCV 81.7  < > 79.9 80.4 81.0  PLT 157  < > 120* 133* 134*  < > = values in this interval not displayed.

## 2017-01-04 NOTE — Telephone Encounter (Signed)
Sched appt 02/01/17; lab at 8:00 and MD at 8:45. No vm, mailed appt letter.

## 2017-01-04 NOTE — Progress Notes (Signed)
Physical Therapy Treatment Patient Details Name: Edward Jordan MRN: [id number] DOB: 1960/11/04 Today's Date: 01/04/2017    History of Present Illness Edward Jordan is a 56 y.o. male with PMH significant for HTN, presenting with 5 day history of poor po intake.  Apparently bed broke 5 days ago, and patient was unable to get off of floor for unknown reasons. Did not take any meds and had almost no po intake. Found covered in urine and feces. Some skin breakdown of perineum and medial thighs at arrival.   Work up revealed dehydration, hyponatremia and elevated creatinine.    PT Comments    Pt finally starting to participate in therapy.  Emphasis on bed mobility and sitting balance and general warm up exercise.Marland Kitchen  Before pt able to work on [place] and transfers, was interrupted by MD who asked this therapist to leave and return at a later time.   Follow Up Recommendations  SNF;Supervision/Assistance - 24 hour     Equipment Recommendations  Rolling walker with 5" wheels;3in1 (PT)    Recommendations for Other Services       Precautions / Restrictions Precautions Precautions: Fall    Mobility  Bed Mobility Overal bed mobility: Needs Assistance Bed Mobility: Sidelying to Sit   Sidelying to sit: Max assist;+2 for safety/equipment       General bed mobility comments: pt waited for physical assist ofr cues before he assisted to come forward and up via R elbow.  Transfers Overall transfer level: Needs assistance               General transfer comment: pt reporting that he was taking his time due to nausea, then MD arrived and asked therapy to leave and return at another convenient time.  Left pt EOB  Ambulation/Gait                 Stairs            Wheelchair Mobility    Modified Rankin (Stroke Patients Only)       Balance Overall balance assessment: Needs assistance Sitting-balance support: Single extremity supported;No upper extremity supported Sitting  balance-Leahy Scale: Fair Sitting balance - Comments: sat EOB x~8 min procrasinating on getting up on his feet.Edward Jordan Arousal/Alertness: Awake/alert Behavior During Therapy: WFL for tasks assessed/performed Overall Cognitive Status: Impaired/Different from baseline Area of Impairment: Attention;Following commands;Safety/judgement;Awareness;Problem solving                   Current Attention Level: Selective   Following Commands: Follows one step commands with increased time;Follows one step commands inconsistently Safety/Judgement: Decreased awareness of deficits Awareness: Emergent Problem Solving: Slow processing;Decreased initiation;Difficulty sequencing;Requires verbal cues        Exercises Other Exercises Other Exercises: warm up hip/knee flexion/ext AAROM    General Comments        Pertinent Vitals/Pain Pain Assessment: Faces Faces Pain Scale: Hurts little more Pain Location: bil feet Pain Descriptors / Indicators: Sore;Grimacing;Guarding Pain Intervention(s): Monitored during session    Home Living                      Prior Function            PT Goals (current goals can now be found in the care plan section) Acute Rehab PT  Goals Patient Stated Goal: pt did not participate with goals--apathetic PT Goal Formulation: With patient Time For Goal Achievement: 01/08/17 Potential to Achieve Goals: Good Progress towards PT goals: Progressing toward goals    Frequency    Min 3X/week      PT Plan Current plan remains appropriate    Co-evaluation              AM-PAC PT "6 Clicks" Daily Activity  Outcome Measure  Difficulty turning over in bed (including adjusting bedclothes, sheets and blankets)?: Total Difficulty moving from lying on back to sitting on the side of the bed? : Total Difficulty sitting down on and standing up from a chair with arms (e.g., wheelchair, bedside  commode, etc,.)?: Total Help needed moving to and from a bed to chair (including a wheelchair)?: A Lot Help needed walking in hospital room?: A Lot Help needed climbing 3-5 steps with a railing? : A Lot 6 Click Score: 9    End of Session   Activity Tolerance: Patient tolerated treatment well Patient left: in bed;with call bell/phone within reach;Other (comment) (left with MD) Nurse Communication: Mobility status PT Visit Diagnosis: Unsteadiness on feet (R26.81);Other abnormalities of gait and mobility (R26.89)     Time: 2956-2130 PT Time Calculation (min) (ACUTE ONLY): 24 min  Charges:  $Therapeutic Exercise: 8-22 mins $Therapeutic Activity: 8-22 mins                    G Codes:       02-02-2017  Edward Jordan, PT [phone]  (pager)   Edward Jordan Edward Jordan 2017/02/02, 1:01 PM

## 2017-01-04 NOTE — Progress Notes (Signed)
Nutrition Brief Note  Patient identified on the Low Braden Report, with a score of 12 or less.  Wt Readings from Last 15 Encounters:  01/04/17 289 lb 14.5 oz (131.5 kg)  04/24/16 (!) 347 lb 10.7 oz (157.7 kg)  06/30/11 (!) 350 lb (158.8 kg)   Edward Jordan is a 56 y.o. male presenting with 5 day history of poor po intake. On admission found to be hypernatremia, dehydrated, and AKI. PMH is significant for htn, ckd, hld, morbid obesity, HFrEF   Pt sleeping soundly at time of visit. Unable to arouse to complete interview.   Pt underwent HD cath placement on 01/03/17; last HD treatment earlier today.   Nutrition-Focused physical exam completed. Findings are no fat depletion, no muscle depletion, and mild edema.   Body mass index is 39.32 kg/m. Patient meets criteria for obesity, class II based on current BMI.   Current diet order is heart healthy/ carb modified, patient is consuming approximately 80% of meals at this time. Labs and medications reviewed.   No nutrition interventions warranted at this time. If nutrition issues arise, please consult RD.   Edward Jordan A. Jimmye Norman, RD, LDN, CDE Pager: [phone] After hours Pager: [phone]

## 2017-01-04 NOTE — Progress Notes (Signed)
Family Medicine Teaching Service Daily Progress Note Intern Pager: [phone]  Patient name: Edward Jordan Medical record number: [id number] Date of birth: 12-07-1960 Age: 56 y.o. Gender: male  Primary Care Provider: Patient, No Pcp Per Consultants: Nephrology, Psychiatry, PT/OT, Nutrition  Code Status: Full  Pt Overview and Major Events to Date:  Edward Jordan is a 56 y.o. Male presenting with hypernatremia, dehydration, and AKI 2/2 prolonged immobility for 5 days. Patient was admitted to [facility] on 12/31/2016. HD on 01/03/2017.   Assessment and Plan: [name] a 56 y.o.malepresenting with 5 day history of poor po intake. On admission found to be hypernatremia, dehydrated, and AKI. PMH is significant for HTN, CKD, HLD, morbid obesity, HFrEF   Dehydration Improved. Patient with no po intake prior to admission in setting of known CKD. Cr was significantly elevated on admission along with BUN, however pt continues to make urine and without signs of uremic encephalopathy or pericarditis. Has history of HFrEF 35% per chart review in 2017, will need to monitor fluid status very carefully. Patient initially refused evaluation for dialysis. Was seen by psychiatry to evaluate for ability to make own, informed decisions. After he was found to be competent, nephrology convinced him of need for dialysis. Patient underwent permcath placement along with fistula creation on 01/03/2017. Current BUN of 168 and Cr of 11.70. Nephrology planned for a second HD and will set up outpatient HD prior to discharge. Vascular surgery will not follow actively in the hospital and will see him in the office in one month for office follow-up and duplex of his fistula. - appreciate vascular surgery recommendations - dialysis per nephrology, appreciate recommendations - Monitor volume status closely - Daily weights, Strict I/Os - daily bmp  Uremia Improving with HD dialysis. Patient with very elevated bun during admission. Bun 199  on admission. Increased to 211 on BMP on 7/23 prior to HD. Current BUN 164 following HD. Current BUN 168. Patient received dialysis access on 7/23. Patient received 1 round of HD and nephrology has planned for a second HD. Patient is agreeable to outpatient HD. Patient with no s/s of encephalopathy and cardiomyopathy - Dialysis per nephrology, appreciate their recs - monitor for s/s encephalopathy, cardiomyopathy  Hypoalbuminemia  Current albumin of 2.7. Albumin of 3.3 on admission. Likely due to protein calorie malnutrition from poor po intake prior to admission. Less likely causes include protein losing nephrotic syndrome and liver cirrhosis. Nutrition recommendations of no interventions warranted at current time.  -Will need to maintain adequate protein for wound healing.  -continue to monitor prealbumin  -appreciate nutrition recommendations   Hypernatremia-resolved Resolved upon rehydration. Na currently 139.On admission with Na of 157. Prior to admissionhad little to no po intake for 5 days. -daily BMP -Nephrology recs  -monitor fluid status  Hypertension Resolved. BP currently 131/68. Had not been taking home meds for last 4-5 days. BP on admission elevated to 174/97. Holding home lasix as patient is in acute renal failure 2/2 dehydration. - vitals per floor routine  - continue coreg 50mg  bid  - continue hydralazine 100mg  tid  - continue imdur 60mg  daily   HFrEF Echo from 2017 showing EF 35-40%. Will need to monitor EKG. Monitor for any s/s of fluid overload given necessity for iv hydration 2/2 dehydration. EKG showing possible left atrial enlargement, but patient is asymptomatic..  - continue coreg - monitor cardiac status - continue to monitor daily weights - I's and O's  Skin Breakdown Patient soiled in own urine and feces for 4  days. Areas of irritation in perineum and medial thighs. Patient denies tenderness or irritation to area, and states nursing care has  improved breakdown. Area clean and dry on inspection. Will need to follow closely, no concern at this time for violation of dermis.No signs of Fournier gangrene on exam. -continue to monitor   Weakness Likely due to morbid obesity and increased with dehydration. Patient apparently unable to get up after bed broke for 4-5 days. Pt likely has sedentary lifestyle but concerning due to pts limited hx of social and living situation. OT recommendation of SNF vs Home Health at discharge. PT recommendation of home health and rolling walker with 5" wheels 3-in-1. Patient does not want SNF placement, but is agreeable to home health.  - appreciate PT recommendations   - appreciate OT recommendations  - will consult social work to discuss discharge needs   Hyperlipidemia Listed in problem list. Lipid panel from 04/2016 grossly normal aside from low hdl. Doesn't take any medication.  -Will likely need to follow this up as an outpatient.  Hyperglycemia A1c on this admission 5.3. Patient with elevated glucose upon admission of 140. Current glucose 114. A1c in November 5.6.  - F/U blood glucose on daily bmp   Nausea Patient complains of 2 days of nausea. States IV Zofran is not alleviating nausea. -Phenergan 12.5 mg IV q6hrs prn  FEN/GI: heart healthy, carb modified  PPx: SCDs, heparin   Disposition: improving   Subjective:  Patient today is improving. States some nausea for 2 days but is not alleviated with Zofran. Patient denies chest pain or shortness of breath. Patient states some pain in feet bilaterally, improved when not wearing socks.   Objective: Temp:  [97.4 F (36.3 C)-98.6 F (37 C)] 98.6 F (37 C) (07/25 0517) Pulse Rate:  [60-66] 66 (07/25 0517) Resp:  [17-20] 19 (07/25 0517) BP: (127-141)/(65-72) 131/68 (07/25 0517) SpO2:  [98 %-100 %] 99 % (07/25 0517) Physical Exam: General: awake and alert, NAD Cardiovascular: RRR, no MRG Respiratory: CTAB, no wheezes, rales, or  rhonchi  Abdomen: soft, non tender, non distended, bowel sounds x4 quadrants  Extremities: no edema, right foot tenderness on soles. Denies tenderness in left foot.   Laboratory:  Recent Labs Lab 01/02/17 0544 01/03/17 1830 01/04/17 0300  WBC 9.9 10.5 6.7  HGB 8.9* 8.8* 8.9*  HCT 26.2* 26.3* 26.8*  PLT 120* 133* 134*    Recent Labs Lab 12/30/16 2145  01/04/17 0246 01/04/17 0619 01/05/17 0350 01/05/17 0744  NA 157*  < > 137 138 139 138  K 5.1  < > 5.5* 4.3 4.8 4.7  CL 126*  < > 110 109 108 108  CO2 11*  < > 11* 14* 16* 17*  BUN 199*  < > 212* 164* 168* 169*  CREATININE 13.24*  < > 13.16* 10.58* 11.70* 11.69*  CALCIUM 8.3*  < > 7.2* 7.2* 7.3* 7.1*  PROT 7.9  --   --   --   --  6.2*  BILITOT 0.9  --   --   --   --  0.6  ALKPHOS 57  --   --   --   --  45  ALT 12*  --  14*  --   --  12*  AST 14*  --   --   --   --  13*  GLUCOSE 139*  < > 121* 102* 114* 110*  < > = values in this interval not displayed.   Imaging/Diagnostic Tests: Dg Chest  Port 1 View  Result Date: 01/03/2017 CLINICAL DATA:  56 year old male status post dialysis catheter placement EXAM: PORTABLE CHEST 1 VIEW COMPARISON:  Prior chest x-ray 04/19/2016 FINDINGS: Right IJ approach tunneled hemodialysis catheter. The catheter tip projects over the upper right atrium. Stable cardiomegaly and mild pulmonary vascular congestion without overt edema. Inspiratory volumes remain low. Probable small chronic right pleural effusion. No evidence pneumothorax. No acute osseous abnormality. IMPRESSION: 1. The tip of a right IJ approach tunneled hemodialysis catheter overlies the upper right atrium. 2. No evidence pneumothorax. 3. Stable cardiomegaly and vascular congestion without overt edema. Electronically Signed   By: Jacqulynn Cadet M.D.   On: 01/03/2017 15:37   Dg Fluoro Guide Cv Line-no Report  Result Date: 01/03/2017 Fluoroscopy was utilized by the requesting physician.  No radiographic interpretation.      Caroline More, DO 01/05/2017, 11:37 AM PGY-1, [place] Intern pager: [phone], text pages welcome

## 2017-01-04 NOTE — Progress Notes (Addendum)
OT Cancellation Note  Patient Details Name: Edward Jordan MRN: [id number] DOB: 1960-09-14   Cancelled Treatment:    Reason Eval/Treat Not Completed: Patient declined; Pt reporting nausea, not feeling well, declining EOB/OOB activity despite encouragement. Will check back at a later date.   Lou Cal, OT Pager [phone] 01/04/2017   Raymondo Band 01/04/2017, 2:09 PM

## 2017-01-04 NOTE — Discharge Summary (Signed)
[facility] Discharge Summary  Patient name: Edward Jordan Medical record number: [id number] Date of birth: 12-Dec-1960 Age: 56 y.o. Gender: male Date of Admission: 12/30/2016  Date of Discharge: 01/10/2017 Admitting Physician: Lind Covert, MD  Primary Care Provider: Patient, No Pcp Per Consultants: nephrology, psychiatry, nutrition, PT, OT  Indication for Hospitalization: dehydration  Discharge Diagnoses/Problem List:  Dehydration-resolved ESRD on HD Hypoalbuminemia-resolved Hypernatremia-resolved Hypertension HFrEF Skin Breakdown Weakness Hyperlipidemia Hyperglycemia-resolved Nausea-resolved Right foot pain  Disposition: home with home health   Discharge Condition: stable, improved  Discharge Exam:  General: awake and alert, sitting up in chair eating breakfast  Cardiovascular: RRR, no MRG  Respiratory: CTAB, no wheezes, rales or rhonchi  Abdomen: soft, non tender, non distended, bowel sounds x 4 quadrants  Extremities: no edema, non tender   Brief Hospital Course:  Edward Jordan is a 56 y.o. Male presenting with dehydration following 5 days without po intake. PMH is significant for htn, ckd, hld, morbid obesity, HFrEF.  Patient was unable to get off floor for unknown reasons and was not able to take any food or medications for 5 days. Patient was found with skin breakdown in perineum and medial thighs due to urine and feces. On admission Na of 157, K 5.1, BUN 199. Cr 12.24. Patient was hypertensive with systolic BP in 628B. Patient showed improvement in electrolytes following oral and IV hydration. Patient initially refused dialysis when discussed with Nephrology, but following psychiatric evaluation for competency patient later agreed to HD. Patient was seen by vascular surgery for dialysis access and received access on 7/23. Following HD initiation patient showed improvement and electrolyte abnormalities improved. BUN and Cr improved following HD.  Labs on discharge of Na 132, K 4.1, Cl 96, BUN 99, Cr 9.59. Patient found to have decreased albumin likely due to protein calorie malnutrition. Improved to prealbumin of 23.5 on discharge. Nutrition was consulted and recommendation of no interventions warranted at current time. Skin breakdown appeared clean and dry and did not appear to be Fournier gangrene on exam. Patient was initially hyperglycemic on admission. A1C of 5.3 on admission with no history of diabetes. Glucose improved to 109 on discharge. Patient has outpatient dialysis scheduled at [facility] for T[facility].  Issues for Follow Up:  1. Will need hospital follow up with PCP  2. Will need follow up with vascular surgery  3. Will need outpatient HD and follow up with nephrology, scheduled at [facility] for TTS. 4. Continued PT/OT, recommendations of SNF placement. Patient denied and agreed to home health  5. Outpatient f/u with PCP for foot pain, likely will need referral to sports medicine or foot inserts   Significant Procedures: hemodialysis   Significant Labs and Imaging:   Recent Labs Lab 01/04/17 0300 01/05/17 2048 01/07/17 1049  WBC 6.7 11.9* 9.3  HGB 8.9* 8.6* 9.4*  HCT 26.8* 25.6* 28.3*  PLT 134* 212 234    Recent Labs Lab 01/03/17 1830 01/04/17 0246  01/05/17 0744 01/05/17 2048 01/06/17 0338 01/07/17 0314 01/08/17 0418 01/09/17 0419  NA 138 137  < > 138 136 137 136 134* 132*  K 5.5* 5.5*  < > 4.7 5.0 3.9 4.3 4.2 4.1  CL 113* 110  < > 108 106 105 103 99* 96*  CO2 10* 11*  < > 17* 15* 20* 16* 23 22  GLUCOSE 127* 121*  < > 110* 117* 95 88 94 109*  BUN 212* 212*  < > 169* 171* 118* 130*  81* 99*  CREATININE 13.09* 13.16*  < > 11.69* 11.84* 8.86* 10.02* 7.78* 9.59*  CALCIUM 7.2* 7.2*  < > 7.1* 7.0* 7.4* 7.9* 8.8* 8.8*  PHOS 9.6*  --   --   --  9.8*  --   --   --   --   ALKPHOS  --   --   --  45  --   --   --   --   --   AST  --   --   --  13*  --   --   --   --   --   ALT  --  14*   --  12*  --   --   --   --   --   ALBUMIN 2.5*  --   --  2.7* 2.8*  --   --   --   --   < > = values in this interval not displayed.  Dg Chest Port 1 View  Result Date: 01/03/2017 CLINICAL DATA:  56 year old male status post dialysis catheter placement EXAM: PORTABLE CHEST 1 VIEW COMPARISON:  Prior chest x-ray 04/19/2016 FINDINGS: Right IJ approach tunneled hemodialysis catheter. The catheter tip projects over the upper right atrium. Stable cardiomegaly and mild pulmonary vascular congestion without overt edema. Inspiratory volumes remain low. Probable small chronic right pleural effusion. No evidence pneumothorax. No acute osseous abnormality. IMPRESSION: 1. The tip of a right IJ approach tunneled hemodialysis catheter overlies the upper right atrium. 2. No evidence pneumothorax. 3. Stable cardiomegaly and vascular congestion without overt edema. Electronically Signed   By: Jacqulynn Cadet M.D.   On: 01/03/2017 15:37   Dg Fluoro Guide Cv Line-no Report  Result Date: 01/03/2017 Fluoroscopy was utilized by the requesting physician.  No radiographic interpretation.     Results/Tests Pending at Time of Discharge: none  Discharge Medications:  Allergies as of 01/10/2017   No Known Allergies     Medication List    STOP taking these medications   furosemide 80 MG tablet Commonly known as:  LASIX     TAKE these medications   aspirin 81 MG EC tablet Take 1 tablet (81 mg total) by mouth daily.   calcitRIOL 0.25 MCG capsule Commonly known as:  ROCALTROL Take 1 capsule (0.25 mcg total) by mouth daily.   calcium acetate 667 MG capsule Commonly known as:  PHOSLO Take 2 capsules (1,334 mg total) by mouth 3 (three) times daily with meals.   carvedilol 25 MG tablet Commonly known as:  COREG Take 2 tablets (50 mg total) by mouth 2 (two) times daily with a meal.   hydrALAZINE 100 MG tablet Commonly known as:  APRESOLINE Take 1 tablet (100 mg total) by mouth 3 (three) times daily.    isosorbide mononitrate 60 MG 24 hr tablet Commonly known as:  IMDUR Take 1 tablet (60 mg total) by mouth daily.   oxyCODONE-acetaminophen 5-325 MG tablet Commonly known as:  PERCOCET/ROXICET Take 2 tablets by mouth every 4 (four) hours as needed for pain.            Durable Medical Equipment        Start     Ordered   01/02/17 0911  For home use only DME Walker rolling  Once    Comments:  With 5" wheels 3 in 1  Question:  Patient needs a walker to treat with the following condition  Answer:  Dependent on walker for ambulation   01/02/17 0911  Discharge Instructions: Please refer to Patient Instructions section of EMR for full details.  Patient was counseled important signs and symptoms that should prompt return to medical care, changes in medications, dietary instructions, activity restrictions, and follow up appointments.   Follow-Up Appointments: Follow-up Information    Early, Arvilla Meres, MD Follow up in 6 week(s).   Specialties:  Vascular Surgery, Cardiology Why:  Our office will call you to arrange an appointment  Contact information: [place] [address] [phone]        [place]. Schedule an appointment as soon as possible for a visit on 01/19/2017.   Specialty:  Family Medicine Why:  hospital post follow up appointment scheduled for 01/19/2017 at 4pm with Domenica Fail PA Contact information: [place] [address] [place], [place], DO 01/10/2017, 12:18 PM PGY-1, [place]

## 2017-01-04 NOTE — Anesthesia Postprocedure Evaluation (Signed)
Anesthesia Post Note  Patient: Edward Jordan  Procedure(s) Performed: Procedure(s) (LRB): INSERTION OF DIALYSIS CATHETER Right Interanl Jugular Placement (Right) ARTERIOVENOUS (AV) FISTULA CREATION (Left)     Patient location during evaluation: PACU Anesthesia Type: General Level of consciousness: awake and sedated Pain management: pain level controlled Vital Signs Assessment: post-procedure vital signs reviewed and stable Respiratory status: spontaneous breathing, nonlabored ventilation, respiratory function stable and patient connected to nasal cannula oxygen Cardiovascular status: blood pressure returned to baseline and stable Postop Assessment: no signs of nausea or vomiting Anesthetic complications: no    Last Vitals:  Vitals:   01/04/17 0545 01/04/17 1044  BP: (!) 147/77 128/67  Pulse: 64 64  Resp: 18   Temp: (!) 36.3 C     Last Pain:  Vitals:   01/04/17 0645  TempSrc:   PainSc: Asleep                 Geanine Vandekamp,JAMES TERRILL

## 2017-01-04 NOTE — Progress Notes (Signed)
Patient ID: Edward Jordan, male   DOB: 27-May-1961, 56 y.o.   MRN: [id number] Postop day 1 from right IJ tunneled catheter placement and left radiocephalic AV fistula. Reports minimal soreness. Had successful hemodialysis yesterday evening via his new catheter  On physical exam his left wrist incision looks good without hematoma. He does have a thrill present.  We'll not follow actively in the hospital. I will see him in the office in one month for office follow-up and duplex of his fistula. We will coordinate this. Please call if we can provide assistance while an inpatient

## 2017-01-04 NOTE — Progress Notes (Signed)
HD tx completed w/o problem, UF goal met, blood rinsed back, report called to Nikki Faucette, RN 

## 2017-01-04 NOTE — Progress Notes (Signed)
HD tx initiated via HD cath w/o problem, pull/push/flush equally w/o problem, VSS, will cont to monitor while on HD tx 

## 2017-01-05 LAB — BASIC METABOLIC PANEL
Anion gap: 15 (ref 5–15)
BUN: 168 mg/dL — AB (ref 6–20)
CALCIUM: 7.3 mg/dL — AB (ref 8.9–10.3)
CO2: 16 mmol/L — AB (ref 22–32)
CREATININE: 11.7 mg/dL — AB (ref 0.61–1.24)
Chloride: 108 mmol/L (ref 101–111)
GFR calc non Af Amer: 4 mL/min — ABNORMAL LOW (ref >60)
GFR, EST AFRICAN AMERICAN: 5 mL/min — AB (ref >60)
Glucose, Bld: 114 mg/dL — ABNORMAL HIGH (ref 65–99)
Potassium: 4.8 mmol/L (ref 3.5–5.1)
SODIUM: 139 mmol/L (ref 135–145)

## 2017-01-05 LAB — RENAL FUNCTION PANEL
ALBUMIN: 2.8 g/dL — AB (ref 3.5–5.0)
ANION GAP: 15 (ref 5–15)
BUN: 171 mg/dL — ABNORMAL HIGH (ref 6–20)
CALCIUM: 7 mg/dL — AB (ref 8.9–10.3)
CO2: 15 mmol/L — ABNORMAL LOW (ref 22–32)
Chloride: 106 mmol/L (ref 101–111)
Creatinine, Ser: 11.84 mg/dL — ABNORMAL HIGH (ref 0.61–1.24)
GFR, EST AFRICAN AMERICAN: 5 mL/min — AB (ref >60)
GFR, EST NON AFRICAN AMERICAN: 4 mL/min — AB (ref >60)
Glucose, Bld: 117 mg/dL — ABNORMAL HIGH (ref 65–99)
PHOSPHORUS: 9.8 mg/dL — AB (ref 2.5–4.6)
POTASSIUM: 5 mmol/L (ref 3.5–5.1)
SODIUM: 136 mmol/L (ref 135–145)

## 2017-01-05 LAB — CBC
HEMATOCRIT: 25.6 % — AB (ref 39.0–52.0)
HEMOGLOBIN: 8.6 g/dL — AB (ref 13.0–17.0)
MCH: 26.9 pg (ref 26.0–34.0)
MCHC: 33.6 g/dL (ref 30.0–36.0)
MCV: 80 fL (ref 78.0–100.0)
Platelets: 212 10*3/uL (ref 150–400)
RBC: 3.2 MIL/uL — AB (ref 4.22–5.81)
RDW: 16.3 % — ABNORMAL HIGH (ref 11.5–15.5)
WBC: 11.9 10*3/uL — ABNORMAL HIGH (ref 4.0–10.5)

## 2017-01-05 LAB — COMPREHENSIVE METABOLIC PANEL
ALBUMIN: 2.7 g/dL — AB (ref 3.5–5.0)
ALK PHOS: 45 U/L (ref 38–126)
ALT: 12 U/L — ABNORMAL LOW (ref 17–63)
ANION GAP: 13 (ref 5–15)
AST: 13 U/L — ABNORMAL LOW (ref 15–41)
BILIRUBIN TOTAL: 0.6 mg/dL (ref 0.3–1.2)
BUN: 169 mg/dL — ABNORMAL HIGH (ref 6–20)
CALCIUM: 7.1 mg/dL — AB (ref 8.9–10.3)
CO2: 17 mmol/L — ABNORMAL LOW (ref 22–32)
Chloride: 108 mmol/L (ref 101–111)
Creatinine, Ser: 11.69 mg/dL — ABNORMAL HIGH (ref 0.61–1.24)
GFR calc non Af Amer: 4 mL/min — ABNORMAL LOW (ref >60)
GFR, EST AFRICAN AMERICAN: 5 mL/min — AB (ref >60)
GLUCOSE: 110 mg/dL — AB (ref 65–99)
POTASSIUM: 4.7 mmol/L (ref 3.5–5.1)
Sodium: 138 mmol/L (ref 135–145)
TOTAL PROTEIN: 6.2 g/dL — AB (ref 6.5–8.1)

## 2017-01-05 LAB — HEPATITIS B SURFACE ANTIGEN: Hepatitis B Surface Ag: NEGATIVE

## 2017-01-05 LAB — HEPATITIS B SURFACE ANTIBODY,QUALITATIVE: HEP B S AB: NONREACTIVE

## 2017-01-05 LAB — HEPATITIS B CORE ANTIBODY, TOTAL: Hep B Core Total Ab: NEGATIVE

## 2017-01-05 MED ORDER — LIDOCAINE-PRILOCAINE 2.5-2.5 % EX CREA
1.0000 "application " | TOPICAL_CREAM | CUTANEOUS | Status: DC | PRN
  Filled 2017-01-05: qty 5

## 2017-01-05 MED ORDER — PENTAFLUOROPROP-TETRAFLUOROETH EX AERO: 1.0000 "application " | INHALATION_SPRAY | CUTANEOUS | Status: DC | PRN

## 2017-01-05 MED ORDER — PROMETHAZINE HCL 25 MG/ML IJ SOLN
12.5000 mg | Freq: Four times a day (QID) | INTRAMUSCULAR | Status: DC | PRN
  Administered 2017-01-05: 12.5 mg via INTRAVENOUS
  Filled 2017-01-05: qty 1

## 2017-01-05 MED ORDER — HEPARIN SODIUM (PORCINE) 1000 UNIT/ML DIALYSIS
1000.0000 [IU] | INTRAMUSCULAR | Status: DC | PRN
  Filled 2017-01-05: qty 1

## 2017-01-05 MED ORDER — ALTEPLASE 2 MG IJ SOLR: 2.0000 mg | Freq: Once | INTRAMUSCULAR | Status: DC | PRN

## 2017-01-05 MED ORDER — SODIUM CHLORIDE 0.9 % IV SOLN: 100.0000 mL | INTRAVENOUS | Status: DC | PRN

## 2017-01-05 MED ORDER — LIDOCAINE HCL (PF) 1 % IJ SOLN
5.0000 mL | INTRAMUSCULAR | Status: DC | PRN
  Filled 2017-01-05: qty 5

## 2017-01-05 NOTE — Progress Notes (Signed)
Occupational Therapy Treatment Patient Details Name: Edward Jordan MRN: [id number] DOB: 09/28/1960 Today's Date: 01/05/2017    History of present illness Edward Jordan is a 56 y.o. male with PMH significant for HTN, presenting with 5 day history of poor po intake.  Apparently bed broke 5 days ago, and patient was unable to get off of floor for unknown reasons. Did not take any meds and had almost no po intake. Found covered in urine and feces. Some skin breakdown of perineum and medial thighs at arrival.   Work up revealed dehydration, hyponatremia and elevated creatinine.   OT comments  Pt with nausea, requiring encouragement to participate in EOB and OOB activity. Progressing slowly. Continues to require 2 person assist for OOB.   Follow Up Recommendations  SNF    Equipment Recommendations  3 in 1 bedside commode;Tub/shower bench    Recommendations for Other Services      Precautions / Restrictions Precautions Precautions: Fall       Mobility Bed Mobility Overal bed mobility: Needs Assistance Bed Mobility: Supine to Sit     Supine to sit: Min guard     General bed mobility comments: no physical assist, heavy use of rail, increased time  Transfers Overall transfer level: Needs assistance Equipment used: Rolling walker (2 wheeled) Transfers: Sit to/from [facility] Sit to Stand: Mod assist;Min assist;+2 physical assistance Stand pivot transfers: Mod assist;+2 physical assistance       General transfer comment: heavy use of the RW.  effortful, uncoordinated stepping and assist needed to move the RW.      Balance Overall balance assessment: Needs assistance Sitting-balance support: Single extremity supported;No upper extremity supported Sitting balance-Leahy Scale: Fair Sitting balance - Comments: sat EOB steady with/without UE's  BP in sitting 126/74   Standing balance support: Bilateral upper extremity supported Standing balance-Leahy Scale: Poor Standing  balance comment: unable to release walker in standing                           ADL either performed or assessed with clinical judgement   ADL Overall ADL's : Needs assistance/impaired     Grooming: Set up;Sitting               Lower Body Dressing: Total assistance Lower Body Dressing Details (indicate cue type and reason): socks Toilet Transfer: +2 for physical assistance;Moderate assistance Toilet Transfer Details (indicate cue type and reason): simulated to chair Toileting- Clothing Manipulation and Hygiene: Total assistance;Sit to/from stand         General ADL Comments: Pt with nausea, requiring maximum encouragement to particpate in EOB and OOB activities.     Vision       Perception     Praxis      Cognition Arousal/Alertness: Awake/alert Behavior During Therapy: WFL for tasks assessed/performed Overall Cognitive Status: Impaired/Different from baseline Area of Impairment: Attention;Following commands;Safety/judgement;Awareness;Problem solving                   Current Attention Level: Selective   Following Commands: Follows one step commands with increased time;Follows one step commands inconsistently Safety/Judgement: Decreased awareness of deficits Awareness: Emergent Problem Solving: Slow processing;Decreased initiation;Difficulty sequencing;Requires verbal cues General Comments: Pt demonstrating decreased understanding of current limitations.         Exercises Other Exercises Other Exercises: warm up hip/knee flexion/ext  with gross estension resistance  8 reps bil.   Shoulder Instructions       General Comments  Pertinent Vitals/ Pain       Pain Assessment: Faces Faces Pain Scale: Hurts even more Pain Location: bil feet Pain Descriptors / Indicators: Sore;Grimacing;Guarding Pain Intervention(s): Monitored during session;Repositioned  Home Living                                          Prior  Functioning/Environment              Frequency  Min 2X/week        Progress Toward Goals  OT Goals(current goals can now be found in the care plan section)  Progress towards OT goals: Not progressing toward goals - comment (pt with weakness and nausea)  Acute Rehab OT Goals Patient Stated Goal: to stop being nauseous OT Goal Formulation: With patient Time For Goal Achievement: 01/16/17 Potential to Achieve Goals: Good  Plan Discharge plan remains appropriate    Co-evaluation                 AM-PAC PT "6 Clicks" Daily Activity     Outcome Measure   Help from another person eating meals?: A Little Help from another person taking care of personal grooming?: A Little Help from another person toileting, which includes using toliet, bedpan, or urinal?: Total Help from another person bathing (including washing, rinsing, drying)?: A Lot Help from another person to put on and taking off regular upper body clothing?: A Little Help from another person to put on and taking off regular lower body clothing?: A Lot 6 Click Score: 14    End of Session Equipment Utilized During Treatment: Gait belt;Rolling walker  OT Visit Diagnosis: Unsteadiness on feet (R26.81);Muscle weakness (generalized) (M62.81);Other symptoms and signs involving cognitive function;Pain Pain - part of body: Ankle and joints of foot   Activity Tolerance Treatment limited secondary to medical complications (Comment) (nausea)   Patient Left in chair;with call bell/phone within reach;with chair alarm set   Nurse Communication Mobility status (pt with nausea, arm bands tight from edema)        Time: 4497-5300 OT Time Calculation (min): 29 min  Charges: OT General Charges $OT Visit: 1 Procedure OT Treatments $Therapeutic Activity: 8-22 mins   Malka So 01/05/2017, 1:55 PM [phone]

## 2017-01-05 NOTE — Progress Notes (Signed)
Admit: 12/30/2016 LOS: 87  56 yo M with progressive CKD5, now ESRD. Admitted 7/19 with severe dehydration and hypernatremia after being found down x 5 days covered in urine and feces. Improved with IVFs. Initially declined HD, however now willing to proceed.   Subjective:  No complaints this AM, no events overnight.   07/24 0701 - 07/25 0700 In: 0  Out: 500 [Urine:500]  Filed Weights   01/03/17 0503 01/04/17 0240 01/04/17 0501  Weight: 296 lb 11.8 oz (134.6 kg) 294 lb 5 oz (133.5 kg) 289 lb 14.5 oz (131.5 kg)    Scheduled Meds: . aspirin EC  81 mg Oral Daily  . calcitRIOL  0.25 mcg Oral Daily  . carvedilol  50 mg Oral BID WC  . Chlorhexidine Gluconate Cloth  6 each Topical Daily  . heparin  5,000 Units Subcutaneous Q8H  . hydrALAZINE  100 mg Oral TID  . isosorbide mononitrate  60 mg Oral Daily  . mupirocin ointment  1 application Nasal BID   Continuous Infusions: . sodium chloride    . sodium chloride    . sodium chloride     PRN Meds:.sodium chloride, sodium chloride, acetaminophen **OR** acetaminophen, alteplase, heparin, lidocaine (PF), lidocaine-prilocaine, oxyCODONE-acetaminophen, pentafluoroprop-tetrafluoroeth, promethazine  Current Labs: reviewed    Physical Exam:  Blood pressure 131/68, pulse 66, temperature 98.6 F (37 C), resp. rate 19, height 6' (1.829 m), weight 289 lb 14.5 oz (131.5 kg), SpO2 99 %. GEN: Obese, NAD CV: RRR PULM: CTAB ABD: Soft, non tender, normal BS EXT: Warm and well perfused, no edema, no asterixis   A 1. ESRD: S/p right IJ TDC and left radiocephalic AVF per [facility] on 7/20. Intermittent HD initiated 7/23, tolerated without complication.  2. Hypernatremia: Resolved with IVFs 3. HTN: Well controlled now, on coreg, hydralazine, and imdur   P  1. Plan for dialysis again today, orders placed.  Will initiate CLIP process for discharge. 2. Daily labs, strict I/Os, daily weights, avoid nephrotoxins.  3. Other issues per primary.   Velna Ochs, M.D. - PGY2 Pager: [phone] 01/05/2017, 11:53 AM    Recent Labs Lab 01/02/17 1233  01/03/17 1830  01/04/17 0619 01/05/17 0350 01/05/17 0744  NA  --   < > 138  < > 138 139 138  K  --   < > 5.5*  < > 4.3 4.8 4.7  CL  --   < > 113*  < > 109 108 108  CO2  --   < > 10*  < > 14* 16* 17*  GLUCOSE  --   < > 127*  < > 102* 114* 110*  BUN  --   < > 212*  < > 164* 168* 169*  CREATININE  --   < > 13.09*  < > 10.58* 11.70* 11.69*  CALCIUM  --   < > 7.2*  < > 7.2* 7.3* 7.1*  PHOS 7.8*  --  9.6*  --   --   --   --   < > = values in this interval not displayed.  Recent Labs Lab 12/30/16 2145  01/02/17 0544 01/03/17 1830 01/04/17 0300  WBC 19.1*  < > 9.9 10.5 6.7  NEUTROABS 17.5*  --   --   --   --   HGB 11.9*  < > 8.9* 8.8* 8.9*  HCT 35.7*  < > 26.2* 26.3* 26.8*  MCV 81.7  < > 79.9 80.4 81.0  PLT 157  < > 120* 133* 134*  < > =  values in this interval not displayed.

## 2017-01-05 NOTE — Progress Notes (Signed)
HD tx completed @ 2305 w/o problem, UF goal met, blood rinsed back, VSS, report called to [place], RN

## 2017-01-05 NOTE — Progress Notes (Signed)
Physical Therapy Treatment Patient Details Name: Edward Jordan MRN: [id number] DOB: [birth year] Today's Date: 01/05/2017    History of Present Illness Edward Jordan is a 56 y.o. male with PMH significant for HTN, presenting with 5 day history of poor po intake.  Apparently bed broke 5 days ago, and patient was unable to get off of floor for unknown reasons. Did not take any meds and had almost no po intake. Found covered in urine and feces. Some skin breakdown of perineum and medial thighs at arrival.   Work up revealed dehydration, hyponatremia and elevated creatinine.    PT Comments    Pt progressing slowly, limited by persistent nausea, foot pain and swelling and general weakness.  Emphasis on sitting balance, standing and transfers.   Follow Up Recommendations  SNF;Supervision/Assistance - 24 hour     Equipment Recommendations  Rolling walker with 5" wheels;3in1 (PT)    Recommendations for Other Services       Precautions / Restrictions Precautions Precautions: Fall Restrictions Weight Bearing Restrictions: No    Mobility  Bed Mobility Overal bed mobility: Needs Assistance Bed Mobility: Supine to Sit     Supine to sit: Min guard (heavy use of the rail)     General bed mobility comments: today came up without assist   Transfers Overall transfer level: Needs assistance   Transfers: Sit to/from Stand;Stand Pivot Transfers Sit to Stand: Mod assist;Min assist;+2 physical assistance Stand pivot transfers: Mod assist;+2 physical assistance       General transfer comment: heavy use of the RW.  effortful, uncoordinated stepping and assist needed to move the RW.    Ambulation/Gait             General Gait Details: pivot only   Stairs            Wheelchair Mobility    Modified Rankin (Stroke Patients Only)       Balance Overall balance assessment: Needs assistance Sitting-balance support: Single extremity supported;No upper extremity supported Sitting  balance-Leahy Scale: Fair Sitting balance - Comments: sat EOB steady with/without UE's  BP in sitting 126/74   Standing balance support: Bilateral upper extremity supported Standing balance-Leahy Scale: Poor Standing balance comment: Reliant on RW                            Cognition Arousal/Alertness: Awake/alert Behavior During Therapy: WFL for tasks assessed/performed Overall Cognitive Status: Impaired/Different from baseline Area of Impairment: Attention;Following commands;Safety/judgement;Awareness;Problem solving                   Current Attention Level: Selective   Following Commands: Follows one step commands with increased time;Follows one step commands inconsistently Safety/Judgement: Decreased awareness of deficits Awareness: Emergent Problem Solving: Slow processing;Decreased initiation;Difficulty sequencing;Requires verbal cues General Comments: Pt demonstrating decreased understanding of current limitations.       Exercises Other Exercises Other Exercises: warm up hip/knee flexion/ext  with gross estension resistance  8 reps bil.    General Comments        Pertinent Vitals/Pain Pain Assessment: Faces Faces Pain Scale: Hurts even more Pain Location: bil feet Pain Descriptors / Indicators: Sore;Grimacing;Guarding Pain Intervention(s): Monitored during session    Home Living                      Prior Function            PT Goals (current goals can now be found in the care  plan section) Acute Rehab PT Goals Patient Stated Goal: pt did not participate with goals--apathetic PT Goal Formulation: With patient Time For Goal Achievement: 01/08/17 Potential to Achieve Goals: Good Progress towards PT goals: Progressing toward goals    Frequency    Min 3X/week      PT Plan Current plan remains appropriate    Co-evaluation              AM-PAC PT "6 Clicks" Daily Activity  Outcome Measure  Difficulty turning over in  bed (including adjusting bedclothes, sheets and blankets)?: A Lot Difficulty moving from lying on back to sitting on the side of the bed? : A Lot Difficulty sitting down on and standing up from a chair with arms (e.g., wheelchair, bedside commode, etc,.)?: Total Help needed moving to and from a bed to chair (including a wheelchair)?: A Lot Help needed walking in hospital room?: A Lot Help needed climbing 3-5 steps with a railing? : A Lot 6 Click Score: 11    End of Session   Activity Tolerance: Patient tolerated treatment well Patient left: in chair;with call bell/phone within reach;with chair alarm set Nurse Communication: Mobility status PT Visit Diagnosis: Unsteadiness on feet (R26.81);Other abnormalities of gait and mobility (R26.89);Pain Pain - Right/Left:  (both) Pain - part of body: Ankle and joints of foot (feet painful and swollen)     Time: 6160-7371 PT Time Calculation (min) (ACUTE ONLY): 31 min  Charges:  $Therapeutic Activity: 8-22 mins                    G Codes:       01/10/2017  Edward Jordan, PT [phone]  (pager)   Edward Jordan Edward Jordan 01-10-2017, 12:46 PM

## 2017-01-05 NOTE — Progress Notes (Signed)
HD tx initiated via HD cath w/o problem, pull/push/flush w/o problem equally, VSS, will cont to monitor while on HD tx

## 2017-01-06 DIAGNOSIS — Z419 Encounter for procedure for purposes other than remedying health state, unspecified: Secondary | ICD-10-CM

## 2017-01-06 DIAGNOSIS — Z95828 Presence of other vascular implants and grafts: Secondary | ICD-10-CM

## 2017-01-06 DIAGNOSIS — Z992 Dependence on renal dialysis: Secondary | ICD-10-CM

## 2017-01-06 LAB — BASIC METABOLIC PANEL
Anion gap: 12 (ref 5–15)
BUN: 118 mg/dL — AB (ref 6–20)
CHLORIDE: 105 mmol/L (ref 101–111)
CO2: 20 mmol/L — AB (ref 22–32)
CREATININE: 8.86 mg/dL — AB (ref 0.61–1.24)
Calcium: 7.4 mg/dL — ABNORMAL LOW (ref 8.9–10.3)
GFR calc Af Amer: 7 mL/min — ABNORMAL LOW (ref >60)
GFR calc non Af Amer: 6 mL/min — ABNORMAL LOW (ref >60)
Glucose, Bld: 95 mg/dL (ref 65–99)
Potassium: 3.9 mmol/L (ref 3.5–5.1)
Sodium: 137 mmol/L (ref 135–145)

## 2017-01-06 LAB — PREALBUMIN: Prealbumin: 20 mg/dL (ref 18–38)

## 2017-01-06 MED ORDER — POLYETHYLENE GLYCOL 3350 17 G PO PACK: 17.0000 g | PACK | Freq: Every day | ORAL | Status: DC | PRN

## 2017-01-06 MED ORDER — CALCIUM ACETATE (PHOS BINDER) 667 MG PO CAPS
1334.0000 mg | ORAL_CAPSULE | Freq: Three times a day (TID) | ORAL | Status: DC
  Administered 2017-01-06 – 2017-01-10 (×12): 1334 mg via ORAL
  Filled 2017-01-06 (×11): qty 2

## 2017-01-06 NOTE — Progress Notes (Signed)
Physical Therapy Treatment Patient Details Name: Edward Jordan MRN: [id number] DOB: 23-Aug-1960 Today's Date: 01/06/2017    History of Present Illness Edward Jordan is a 56 y.o. male with PMH significant for HTN, presenting with 5 day history of poor po intake.  Apparently bed broke 5 days ago, and patient was unable to get off of floor for unknown reasons. Did not take any meds and had almost no po intake. Found covered in urine and feces. Some skin breakdown of perineum and medial thighs at arrival.   Work up revealed dehydration, hyponatremia and elevated creatinine.    PT Comments    Pt is able to progress to +1 for transfers and gait this session. Pt is able to perform short distance gait to door and back and will benefit from progression of gait. Pt continues to benefit most from SNF at discharge in order to maximize his outcomes. Pt will now have any assistance upon returning home.     Follow Up Recommendations  SNF;Supervision/Assistance - 24 hour     Equipment Recommendations  Rolling walker with 5" wheels;3in1 (PT)    Recommendations for Other Services       Precautions / Restrictions Precautions Precautions: Fall Restrictions Weight Bearing Restrictions: No    Mobility  Bed Mobility               General bed mobility comments: pt sitting up in recliner when PT arrives  Transfers Overall transfer level: Needs assistance Equipment used: Rolling walker (2 wheeled) Transfers: Sit to/from Stand Sit to Stand: Mod assist         General transfer comment: [place] reliance on Rw with Mod A to stand up to RW. Cues for pushing up from recliner.   Ambulation/Gait Ambulation/Gait assistance: Min guard;Min assist Ambulation Distance (Feet): 20 Feet Assistive device: Rolling walker (2 wheeled) Gait Pattern/deviations: Step-to pattern;Decreased step length - right;Decreased step length - left;Shuffle Gait velocity: decreased Gait velocity interpretation: Below normal speed for  age/gender General Gait Details: Pt performs short distance gait in room with Min A initally progressing to min guard. Shuffeling gait pattern noted this session. Decreased assistance required as compared to previous sessions.    Stairs            Wheelchair Mobility    Modified Rankin (Stroke Patients Only)       Balance Overall balance assessment: Needs assistance Sitting-balance support: No upper extremity supported;Feet supported Sitting balance-Leahy Scale: Good     Standing balance support: Bilateral upper extremity supported Standing balance-Leahy Scale: Poor Standing balance comment: heavy reliance on RW to stabilize in standing                            Cognition Arousal/Alertness: Awake/alert Behavior During Therapy: WFL for tasks assessed/performed Overall Cognitive Status: Impaired/Different from baseline Area of Impairment: Memory;Safety/judgement                     Memory: Decreased short-term memory   Safety/Judgement: Decreased awareness of safety     General Comments: pt reports that he has been getting up and moving around room all day. Per NT, pt has been in recliner since AM      Exercises      General Comments General comments (skin integrity, edema, etc.): pt is initally resistant to mobility, but warm up and jokes more toward end of session following gait.       Pertinent Vitals/Pain Pain Assessment: No/denies  pain    Home Living                      Prior Function            PT Goals (current goals can now be found in the care plan section) Acute Rehab PT Goals Patient Stated Goal: to feel better and get home Progress towards PT goals: Progressing toward goals    Frequency    Min 3X/week      PT Plan Current plan remains appropriate    Co-evaluation              AM-PAC PT "6 Clicks" Daily Activity  Outcome Measure  Difficulty turning over in bed (including adjusting bedclothes,  sheets and blankets)?: A Lot Difficulty moving from lying on back to sitting on the side of the bed? : A Lot Difficulty sitting down on and standing up from a chair with arms (e.g., wheelchair, bedside commode, etc,.)?: Total Help needed moving to and from a bed to chair (including a wheelchair)?: A Lot Help needed walking in hospital room?: A Lot Help needed climbing 3-5 steps with a railing? : A Lot 6 Click Score: 11    End of Session Equipment Utilized During Treatment: Gait belt Activity Tolerance: Patient tolerated treatment well Patient left: in bed;with call bell/phone within reach Nurse Communication: Mobility status PT Visit Diagnosis: Unsteadiness on feet (R26.81);Other abnormalities of gait and mobility (R26.89);Pain     Time: 9702-6378 PT Time Calculation (min) (ACUTE ONLY): 15 min  Charges:  $Gait Training: 8-22 mins                    G Codes:       Scheryl Marten PT, DPT  [phone]    Jacqulyn Liner Sloan Leiter 01/06/2017, 4:30 PM

## 2017-01-06 NOTE — NC FL2 (Signed)
[place] LEVEL OF CARE SCREENING TOOL     IDENTIFICATION  Patient Name: Edward Jordan Birthdate: 04/19/1961 Sex: male Admission Date (Current Location): 12/30/2016  [facility] and [place] Number:  Herbalist and Address:  [facility] [place] [facility] [address] [place], [place] [address]      Provider Number: [id number]  Attending Physician Name and Address:  Lind Covert, MD  Relative Name and Phone Number:       Current Level of Care: Hospital Recommended Level of Care: [place] Prior Approval Number:    Date Approved/Denied:   PASRR Number: [id number]  Discharge Plan: SNF    Current Diagnoses: Patient Active Problem List   Diagnosis Date Noted  . Status post insertion of dialysis catheter ([place])   . Surgery, elective   . Hypernatremia   . Acute renal failure ([place]) 12/31/2016  . Congestive heart failure ([place])   . Hyperkalemia   . Essential hypertension   . Acute renal failure (ARF) ([place]) 04/19/2016  . Acute systolic congestive heart failure ([place]) 04/19/2016  . Acute on chronic systolic CHF (congestive heart failure) ([place]) 07/02/2011  . Accelerated hypertension 07/01/2011  . CKD (chronic kidney disease), stage III 07/01/2011  . Morbid obesity ([place]) 07/01/2011  . Hyperglycemia 07/01/2011  . Elevated CK 07/01/2011  . Hyperlipidemia 07/01/2011    Orientation RESPIRATION BLADDER Height & Weight     Self, Time, Situation, Place  Normal Continent Weight: 133.5 kg (294 lb 5 oz) Height:  6' (182.9 cm)  BEHAVIORAL SYMPTOMS/MOOD NEUROLOGICAL BOWEL NUTRITION STATUS      Continent Diet (Please see DC Summary)  AMBULATORY STATUS COMMUNICATION OF NEEDS Skin   Extensive Assist Verbally Surgical wounds (Closed incision on neck and arm)                       Personal Care Assistance Level of Assistance  Bathing, Feeding, Dressing Bathing Assistance: Maximum assistance Feeding assistance: Independent Dressing  Assistance: Limited assistance     Functional Limitations Info             SPECIAL CARE FACTORS FREQUENCY  PT (By licensed PT), OT (By licensed OT)     PT Frequency: 5x/week OT Frequency: 3x/week            Contractures      Additional Factors Info  Code Status, Allergies Code Status Info: Full Allergies Info: NKA           Current Medications (01/06/2017):  This is the current hospital active medication list Current Facility-Administered Medications  Medication Dose Route Frequency Provider Last Rate Last Dose  . 0.9 %  sodium chloride infusion  100 mL Intravenous PRN Velna Ochs, MD      . 0.9 %  sodium chloride infusion  100 mL Intravenous PRN Velna Ochs, MD      . 0.9 %  sodium chloride infusion   Intravenous Continuous Massagee, Terry, MD      . 0.9 %  sodium chloride infusion  100 mL Intravenous PRN Velna Ochs, MD      . 0.9 %  sodium chloride infusion  100 mL Intravenous PRN Velna Ochs, MD      . acetaminophen (TYLENOL) tablet 650 mg  650 mg Oral Q6H PRN Guadalupe Dawn, MD   650 mg at 01/03/17 0998   Or  . acetaminophen (TYLENOL) suppository 650 mg  650 mg Rectal Q6H PRN Guadalupe Dawn, MD      .  alteplase (CATHFLO ACTIVASE) injection 2 mg  2 mg Intracatheter Once PRN Velna Ochs, MD      . aspirin EC tablet 81 mg  81 mg Oral Daily Guadalupe Dawn, MD   81 mg at 01/06/17 0835  . calcitRIOL (ROCALTROL) capsule 0.25 mcg  0.25 mcg Oral Daily Guadalupe Dawn, MD   0.25 mcg at 01/06/17 0834  . carvedilol (COREG) tablet 50 mg  50 mg Oral BID WC Forde Dandy, MD   50 mg at 01/06/17 0835  . Chlorhexidine Gluconate Cloth 2 % PADS 6 each  6 each Topical Daily Early, Arvilla Meres, MD   6 each at 01/04/17 1100  . heparin injection 1,000 Units  1,000 Units Dialysis PRN Velna Ochs, MD      . heparin injection 1,000 Units  1,000 Units Dialysis PRN Velna Ochs, MD      . heparin injection 5,000 Units  5,000 Units Subcutaneous Q8H  Guadalupe Dawn, MD   5,000 Units at 01/06/17 0549  . hydrALAZINE (APRESOLINE) tablet 100 mg  100 mg Oral TID Forde Dandy, MD   100 mg at 01/06/17 0835  . isosorbide mononitrate (IMDUR) 24 hr tablet 60 mg  60 mg Oral Daily Forde Dandy, MD   60 mg at 01/06/17 0835  . lidocaine (PF) (XYLOCAINE) 1 % injection 5 mL  5 mL Intradermal PRN Velna Ochs, MD      . lidocaine-prilocaine (EMLA) cream 1 application  1 application Topical PRN Velna Ochs, MD      . mupirocin ointment (BACTROBAN) 2 % 1 application  1 application Nasal BID Early, Arvilla Meres, MD   1 application at 96/43/83 [phone]  . oxyCODONE-acetaminophen (PERCOCET/ROXICET) 5-325 MG per tablet 1-2 tablet  1-2 tablet Oral Q4H PRN Alvia Grove, PA-C   1 tablet at 01/06/17 0849  . pentafluoroprop-tetrafluoroeth (GEBAUERS) aerosol 1 application  1 application Topical PRN Velna Ochs, MD      . promethazine (PHENERGAN) injection 12.5 mg  12.5 mg Intravenous Q6H PRN Caroline More, DO   12.5 mg at 01/05/17 1235     Discharge Medications: Please see discharge summary for a list of discharge medications.  Relevant Imaging Results:  Relevant Lab Results:   Additional Information SSN: [id number]   [facility] HD, not yet clipped.  Benard Halsted, LCSWA

## 2017-01-06 NOTE — Progress Notes (Signed)
PT Cancellation Note  Patient Details Name: Deuntae Kocsis MRN: [id number] DOB: [birth year]   Cancelled Treatment:    Reason Eval/Treat Not Completed: Patient declined, no reason specified. Pt sitting up in recliner on the phone. Reports he has been moving around a lot this AM. Requests PT check back later. Will check back as time allows.    Scheryl Marten PT, DPT  [phone]  01/06/2017, 1:20 PM

## 2017-01-06 NOTE — Progress Notes (Signed)
Family Medicine Teaching Service Daily Progress Note Intern Pager: [phone]  Patient name: Edward Jordan Medical record number: [id number] Date of birth: 11/27/1960 Age: 56 y.o. Gender: male  Primary Care Provider: Patient, No Pcp Per Consultants: Nephrology, Psychiatry, PT/OT, Nutrition  Code Status: Full   Pt Overview and Major Events to Date:  Edward Jordan is a 56 y.o. Male presenting with hypernatremia, dehydration, and AKI 2/2 prolonged immobility for 5 days. Patient was admitted to [facility] on 12/31/2016. HD on 01/03/2017 and 01/05/2017.   Assessment and Plan: Edward Jordan a 56 y.o.malepresenting with 5 day history of poor po intake. On admission found to be hypernatremia, dehydrated, and AKI. PMH is significant for HTN, CKD, HLD, morbid obesity, HFrEF   Dehydration Improved. Patient with no po intake prior to admission in setting of known CKD. Cr was significantly elevated on admissionalong with BUN, however pt continues to make urine and without signs of uremic encephalopathy or pericarditis. Has history of HFrEF 35% per chart review in 2017, will need to monitor fluid status very carefully. Patient initially refused evaluation for dialysis. Was seen by psychiatry to evaluate for ability to make own, informed decisions. After he was found to be competent, nephrology convinced him of need for dialysis. Patient underwent permcath placement along with fistula creation on 01/03/2017. Current electrolyte lab values wnl, with Na of 137, K 3.9, Cl 105. Current BUN of 118 and Cr of 8.86, showing improvement following HD initiation. Nephrology will set up outpatient HD prior to discharge. Vascular surgery will not follow actively in the hospital and willsee him in the office in one month for office follow-up and duplex of his fistula. - appreciate vascular surgery recommendations - dialysis per nephrology, appreciate recommendations - Monitor volume status closely - Daily weights, Strict I/Os - daily  bmp  Uremia Improving with HD dialysis. Patient with very elevated bun during admission. Bun 199 on admission. Increased to 211 on BMP on 7/23 prior to HD. Current BUN 118 following HD. Patient received dialysis access on 7/23. Patient received two dialysis treatments. Patient is agreeable to outpatient HD. Patient with no s/s of encephalopathy and cardiomyopathy - Dialysis per nephrology,appreciate their recs - monitor for s/s encephalopathy, cardiomyopathy  Hypoalbuminemia  Current prealbumin 20.0, showing adequate nutritional support. Likely due to protein calorie malnutrition from poor po intake prior to admission. Less likely causes include protein losing nephrotic syndrome and liver cirrhosis. Nutrition recommendations of no interventions warranted at current time. -Will need to maintain adequate protein for wound healing.  -continue to monitor prealbumin  -appreciate nutrition recommendations   Hypernatremia-resolved Resolved upon rehydration. Na currently 137.On admission with Na of 157. Prior to admissionhad little to no po intake for 5 days. -daily BMP -Nephrology recs  -monitor fluid status  Hypertension Resolved. BP currently 133/64. Had not been taking home meds for last 4-5 days prior to admission. BP on admission elevated to 174/97. Holding home lasix as patient is in acute renal failure 2/2 dehydration. - vitals per floor routine  - continue coreg 50mg  bid  - continue hydralazine 100mg  tid  - continue imdur 60mg  daily   HFrEF Echo from 2017 showing EF 35-40%. Will need to monitor EKG. Monitor for any s/s of fluid overload given necessity for iv hydration 2/2 dehydration. EKG showing possible left atrial enlargement, but patient is asymptomatic. - continue coreg - monitor cardiac status - continue to monitor daily weights - I's and O's  Skin Breakdown Patient soiled in own urine and feces for 4 days.  Areas of irritation in perineum and medial thighs.  Patient denies tenderness or irritation to area, and states nursing care has improved breakdown. Area clean and dry on inspection. Will need to follow closely, no concern at this time for violation of dermis.No signs of Fournier gangrene on exam. -continue to monitor   Weakness Likely due to morbid obesity and increased with dehydration. Patient apparently unable to get up after bed broke for 4-5 days. Pt likely has sedentary lifestyle but concerning due to pts limited hx of social and living situation. OT recommendation of SNF placement with 3 in 1 bedside commode, tub/shower bench. PT recommendation of SNF and rolling walker with 5" wheels 3-in-1. Patient today states he does not desire SNF placement and would like home health instead. Patient would like assistance with transport to HD.  - appreciate PT recommendations  - appreciate OT recommendations  - will consult social work to discuss discharge needs   Hyperlipidemia Listed in problem list. Lipid panel from 04/2016 grossly normal aside from low hdl. Doesn't take any medication.  -Will likely need to follow this up as an outpatient.  Hyperglycemia A1c on this admission 5.3. Patient with elevated glucose upon admission of 140. Current glucose 95. A1c in November 5.6.  - F/U blood glucose on daily bmp   Nausea-resolved Patient no longer complains of nausea. States Phenergan has helped.  -Phenergan 12.5 mg IV q6hrs prn  Right foot pain Patient complains of right foot pain in sole of right foot. Possible plantar fasciitis. Unlikely neuropathy given location of pain and lack of history of precipitating diseases such as diabetes. Unlikely metatarsal pain due to location of pain as entire sole -continue to monitor -encourage ambulation with PT as tolerated  -tylenol prn for pain   FEN/GI: heart healthy, carb modified  PPx: SCDs, heparin   Disposition: home health, awaiting CLIP  Subjective:  Patient today states he feels  better after HD. States no chest pain, SOB, muscle cramping, or nausea. Patient still attests foot pain on right foot but was able to walk with PT. Patient is tolerating diet well. Patient does not desire SNF placement and would instead prefer home health and assistance with transport to dialysis.   Objective: Temp:  [97.7 F (36.5 C)-98.8 F (37.1 C)] 98.8 F (37.1 C) (07/26 0515) Pulse Rate:  [57-70] 70 (07/26 0515) Resp:  [10-20] 18 (07/26 0515) BP: (122-162)/(64-81) 133/64 (07/26 0515) SpO2:  [97 %-100 %] 99 % (07/26 0515) Weight:  [289 lb 7.4 oz (131.3 kg)-294 lb 5 oz (133.5 kg)] 294 lb 5 oz (133.5 kg) (07/26 0515) Physical Exam: General: awake and alert, sitting in chair watching tv  Cardiovascular: RRR, no MRG  Respiratory: CTAB, no wheezes, rales or rhonchi  Abdomen: soft, non tender, non distended  Extremities: no pitting edema, tenderness to palpation of sole of right foot.   Laboratory:  Recent Labs Lab 01/03/17 1830 01/04/17 0300 01/05/17 2048  WBC 10.5 6.7 11.9*  HGB 8.8* 8.9* 8.6*  HCT 26.3* 26.8* 25.6*  PLT 133* 134* 212    Recent Labs Lab 12/30/16 2145  01/04/17 0246  01/05/17 0744 01/05/17 2048 01/06/17 0338  NA 157*  < > 137  < > 138 136 137  K 5.1  < > 5.5*  < > 4.7 5.0 3.9  CL 126*  < > 110  < > 108 106 105  CO2 11*  < > 11*  < > 17* 15* 20*  BUN 199*  < > 212*  < >  169* 171* 118*  CREATININE 13.24*  < > 13.16*  < > 11.69* 11.84* 8.86*  CALCIUM 8.3*  < > 7.2*  < > 7.1* 7.0* 7.4*  PROT 7.9  --   --   --  6.2*  --   --   BILITOT 0.9  --   --   --  0.6  --   --   ALKPHOS 57  --   --   --  45  --   --   ALT 12*  --  14*  --  12*  --   --   AST 14*  --   --   --  13*  --   --   GLUCOSE 139*  < > 121*  < > 110* 117* 95  < > = values in this interval not displayed.   Imaging/Diagnostic Tests: Dg Chest Port 1 View  Result Date: 01/03/2017 CLINICAL DATA:  56 year old male status post dialysis catheter placement EXAM: PORTABLE CHEST 1 VIEW  COMPARISON:  Prior chest x-ray 04/19/2016 FINDINGS: Right IJ approach tunneled hemodialysis catheter. The catheter tip projects over the upper right atrium. Stable cardiomegaly and mild pulmonary vascular congestion without overt edema. Inspiratory volumes remain low. Probable small chronic right pleural effusion. No evidence pneumothorax. No acute osseous abnormality. IMPRESSION: 1. The tip of a right IJ approach tunneled hemodialysis catheter overlies the upper right atrium. 2. No evidence pneumothorax. 3. Stable cardiomegaly and vascular congestion without overt edema. Electronically Signed   By: Jacqulynn Cadet M.D.   On: 01/03/2017 15:37   Dg Fluoro Guide Cv Line-no Report  Result Date: 01/03/2017 Fluoroscopy was utilized by the requesting physician.  No radiographic interpretation.     Caroline More, DO 01/06/2017, 11:23 AM PGY-1, [place] Intern pager: [phone], text pages welcome

## 2017-01-06 NOTE — Progress Notes (Signed)
Admit: 12/30/2016 LOS: 53  56 yo M with progressive CKD5, now ESRD. Admitted 7/19 with severe dehydration and hypernatremia after being found down x 5 days covered in urine and feces. Improved with IVFs. Initially declined HD, however now willing to proceed.   Subjective:  S/p second dialysis session yesterday without complication. Feels well, no events overnight. Planning for SNF on discharge.   07/25 0701 - 07/26 0700 In: 480 [P.O.:480] Out: 2000   Filed Weights   01/05/17 2028 01/05/17 2312 01/06/17 0515  Weight: 293 lb 14 oz (133.3 kg) 289 lb 7.4 oz (131.3 kg) 294 lb 5 oz (133.5 kg)    Scheduled Meds: . aspirin EC  81 mg Oral Daily  . calcitRIOL  0.25 mcg Oral Daily  . carvedilol  50 mg Oral BID WC  . Chlorhexidine Gluconate Cloth  6 each Topical Daily  . heparin  5,000 Units Subcutaneous Q8H  . hydrALAZINE  100 mg Oral TID  . isosorbide mononitrate  60 mg Oral Daily  . mupirocin ointment  1 application Nasal BID   Continuous Infusions: . sodium chloride    . sodium chloride    . sodium chloride    . sodium chloride    . sodium chloride     PRN Meds:.sodium chloride, sodium chloride, sodium chloride, sodium chloride, acetaminophen **OR** acetaminophen, alteplase, heparin, heparin, lidocaine (PF), lidocaine-prilocaine, oxyCODONE-acetaminophen, pentafluoroprop-tetrafluoroeth, promethazine  Current Labs: reviewed    Physical Exam:  Blood pressure 133/64, pulse 70, temperature 98.8 F (37.1 C), resp. rate 18, height 6' (1.829 m), weight 294 lb 5 oz (133.5 kg), SpO2 99 %. GEN: Obese, NAD CV: RRR PULM: CTAB ABD: Soft, non tender, normal BS EXT: Warm and well perfused, no edema, no asterixis. LUE fistula with palpable thrill, radial pulse 2+.   A 1. ESRD: S/p right IJ TDC and left radiocephalic AVF per [facility] on 1/91. Intermittent HD initiated 7/23, s/p 2 sessions.  2. Hypernatremia: Resolved with IVFs 3. HTN: Well controlled now, on coreg, hydralazine, and imdur   P  1.  Plan for dialysis again tomorrow. Discharge pending CLIP process. Start phoslo 1,334 TID with meals.  2. Daily labs, strict I/Os, daily weights, avoid nephrotoxins.  3. Other issues per primary.   Velna Ochs, M.D. - PGY2 Pager: [phone] 01/06/2017, 9:00 AM    Recent Labs Lab 01/02/17 1233  01/03/17 1830  01/05/17 0744 01/05/17 2048 01/06/17 0338  NA  --   < > 138  < > 138 136 137  K  --   < > 5.5*  < > 4.7 5.0 3.9  CL  --   < > 113*  < > 108 106 105  CO2  --   < > 10*  < > 17* 15* 20*  GLUCOSE  --   < > 127*  < > 110* 117* 95  BUN  --   < > 212*  < > 169* 171* 118*  CREATININE  --   < > 13.09*  < > 11.69* 11.84* 8.86*  CALCIUM  --   < > 7.2*  < > 7.1* 7.0* 7.4*  PHOS 7.8*  --  9.6*  --   --  9.8*  --   < > = values in this interval not displayed.  Recent Labs Lab 12/30/16 2145  01/03/17 1830 01/04/17 0300 01/05/17 2048  WBC 19.1*  < > 10.5 6.7 11.9*  NEUTROABS 17.5*  --   --   --   --   HGB 11.9*  < >  8.8* 8.9* 8.6*  HCT 35.7*  < > 26.3* 26.8* 25.6*  MCV 81.7  < > 80.4 81.0 80.0  PLT 157  < > 133* 134* 212  < > = values in this interval not displayed.

## 2017-01-07 LAB — CBC
HEMATOCRIT: 28.3 % — AB (ref 39.0–52.0)
HEMOGLOBIN: 9.4 g/dL — AB (ref 13.0–17.0)
MCH: 26.9 pg (ref 26.0–34.0)
MCHC: 33.2 g/dL (ref 30.0–36.0)
MCV: 81.1 fL (ref 78.0–100.0)
Platelets: 234 10*3/uL (ref 150–400)
RBC: 3.49 MIL/uL — ABNORMAL LOW (ref 4.22–5.81)
RDW: 16.2 % — AB (ref 11.5–15.5)
WBC: 9.3 10*3/uL (ref 4.0–10.5)

## 2017-01-07 LAB — BASIC METABOLIC PANEL
ANION GAP: 17 — AB (ref 5–15)
BUN: 130 mg/dL — AB (ref 6–20)
CHLORIDE: 103 mmol/L (ref 101–111)
CO2: 16 mmol/L — AB (ref 22–32)
Calcium: 7.9 mg/dL — ABNORMAL LOW (ref 8.9–10.3)
Creatinine, Ser: 10.02 mg/dL — ABNORMAL HIGH (ref 0.61–1.24)
GFR calc Af Amer: 6 mL/min — ABNORMAL LOW (ref >60)
GFR calc non Af Amer: 5 mL/min — ABNORMAL LOW (ref >60)
GLUCOSE: 88 mg/dL (ref 65–99)
POTASSIUM: 4.3 mmol/L (ref 3.5–5.1)
Sodium: 136 mmol/L (ref 135–145)

## 2017-01-07 LAB — PREALBUMIN: PREALBUMIN: 23.5 mg/dL (ref 18–38)

## 2017-01-07 MED ORDER — SODIUM CHLORIDE 0.9 % IV SOLN: 100.0000 mL | INTRAVENOUS | Status: DC | PRN

## 2017-01-07 MED ORDER — PENTAFLUOROPROP-TETRAFLUOROETH EX AERO: 1.0000 "application " | INHALATION_SPRAY | CUTANEOUS | Status: DC | PRN

## 2017-01-07 MED ORDER — HEPARIN SODIUM (PORCINE) 1000 UNIT/ML DIALYSIS: 1000.0000 [IU] | INTRAMUSCULAR | Status: DC | PRN

## 2017-01-07 MED ORDER — LIDOCAINE HCL (PF) 1 % IJ SOLN: 5.0000 mL | INTRAMUSCULAR | Status: DC | PRN

## 2017-01-07 MED ORDER — LIDOCAINE-PRILOCAINE 2.5-2.5 % EX CREA: 1.0000 "application " | TOPICAL_CREAM | CUTANEOUS | Status: DC | PRN

## 2017-01-07 MED ORDER — ALTEPLASE 2 MG IJ SOLR: 2.0000 mg | Freq: Once | INTRAMUSCULAR | Status: DC | PRN

## 2017-01-07 NOTE — Progress Notes (Signed)
Occupational Therapy Treatment Patient Details Name: Edward Jordan MRN: [id number] DOB: June 30, 1960 Today's Date: 01/07/2017    History of present illness Edward Jordan is a 56 y.o. male with PMH significant for HTN, presenting with 5 day history of poor po intake.  Apparently bed broke 5 days ago, and patient was unable to get off of floor for unknown reasons. Did not take any meds and had almost no po intake. Found covered in urine and feces. Some skin breakdown of perineum and medial thighs at arrival.   Work up revealed dehydration, hyponatremia and elevated creatinine.      Follow Up Recommendations  SNF    Equipment Recommendations  3 in 1 bedside commode;Tub/shower bench       Precautions / Restrictions Precautions Precautions: Fall Restrictions Weight Bearing Restrictions: No       Mobility Bed Mobility Overal bed mobility: Needs Assistance Bed Mobility: Supine to Sit   Sidelying to sit: Min assist Supine to sit: Min assist        Transfers Overall transfer level: Needs assistance Equipment used: Rolling walker (2 wheeled) Transfers: Sit to/from [facility] Sit to Stand: Mod assist;Min assist;+2 physical assistance Stand pivot transfers: Mod assist;+2 physical assistance       General transfer comment: CNA present for transfer        ADL either performed or assessed with clinical judgement   ADL Overall ADL's : Needs assistance/impaired Eating/Feeding: Set up;Sitting   Grooming: Set up;Sitting   Upper Body Bathing: Minimal assistance;Sitting   Lower Body Bathing: Moderate assistance;Sit to/from stand;Cueing for safety;Cueing for sequencing   Upper Body Dressing : Minimal assistance;Sitting   Lower Body Dressing: Moderate assistance;Sit to/from stand;Cueing for safety;Cueing for sequencing;Maximal assistance Lower Body Dressing Details (indicate cue type and reason): socks Toilet Transfer: +2 for physical assistance;Moderate  assistance Toilet Transfer Details (indicate cue type and reason): simulated to chair Toileting- Clothing Manipulation and Hygiene: Sit to/from stand;Maximal assistance;Cueing for safety;Cueing for sequencing         General ADL Comments: pt needed max encouragement to do things for him self- such as bathing after he was in a bed soaked with urine as pt had spilled the urinal     Vision Patient Visual Report: No change from baseline            Cognition Arousal/Alertness: Awake/alert Behavior During Therapy: Flat affect Overall Cognitive Status: No family/caregiver present to determine baseline cognitive functioning                                                     Pertinent Vitals/ Pain       Pain Assessment: No/denies pain         Frequency  Min 2X/week        Progress Toward Goals  OT Goals(current goals can now be found in the care plan section)  Progress towards OT goals: Progressing toward goals     Plan Discharge plan remains appropriate       AM-PAC PT "6 Clicks" Daily Activity     Outcome Measure   Help from another person eating meals?: A Little Help from another person taking care of personal grooming?: A Little Help from another person toileting, which includes using toliet, bedpan, or urinal?: Total Help from another person bathing (including washing, rinsing, drying)?: A Lot Help from  another person to put on and taking off regular upper body clothing?: A Little Help from another person to put on and taking off regular lower body clothing?: A Lot 6 Click Score: 14    End of Session Equipment Utilized During Treatment: Gait belt;Rolling walker  OT Visit Diagnosis: Unsteadiness on feet (R26.81);Muscle weakness (generalized) (M62.81);Other symptoms and signs involving cognitive function;Pain Pain - part of body: Ankle and joints of foot   Activity Tolerance Treatment limited secondary to medical complications  (Comment);Patient tolerated treatment well (nausea)   Patient Left in chair;with call bell/phone within reach;with chair alarm set   Nurse Communication Mobility status (pt with nausea, arm bands tight from edema)        Time: 2993-7169 OT Time Calculation (min): 19 min  Charges: OT General Charges $OT Visit: 1 Procedure OT Treatments $Self Care/Home Management : 8-22 mins  [place], [place]   Betsy Pries 01/07/2017, 1:22 PM

## 2017-01-07 NOTE — Procedures (Signed)
I have seen and examined this patient and agree with the plan of care   56 yo M with progressive CKD5, now ESRD. Admitted 7/19 with severe dehydration and hypernatremia after being found down x 5 days covered in urine and feces. Improved with IVFs. Initially declined HD, however now willing to proceed.   Seen on dialysis  BP 283-662   Systolic  Pulse 70    New start   CLIP process  Hb 8.6    Ca 7.3  Alb 2.7   Co2 16  K 4.8     Sherene Plancarte W 01/07/2017, 10:47 AM

## 2017-01-07 NOTE — Progress Notes (Signed)
Family Medicine Teaching Service Daily Progress Note Intern Pager: [phone]  Patient name: Edward Jordan Medical record number: [id number] Date of birth: 1961/02/19 Age: 56 y.o. Gender: male  Primary Care Provider: Patient, No Pcp Per Consultants: Nephrology, Psychiatry, PT/OT, Nutrition  Code Status: Full   Pt Overview and Major Events to Date:  Edward Jordan is a 56 y.o. Male presenting with hypernatremia, dehydration, and AKI 2/2 prolonged immobility for 5 days. Patient was admitted to [facility] on 12/31/2016. HD on 01/03/2017 and 01/05/2017.   Assessment and Plan: Reynold Mantell a 56 y.o.malepresenting with 5 day history of poor po intake. On admission found to be hypernatremia, dehydrated, and AKI. PMH is significant for HTN, CKD, HLD, morbid obesity, HFrEF   Dehydration Improved. Patient with no po intake prior to admission in setting of known CKD. Cr was significantly elevated on admissionalong with BUN, however pt continues to make urine and without signs of uremic encephalopathy or pericarditis. Has history of HFrEF 35% per chart review in 2017, will need to monitor fluid status very carefully. Patient initially refused evaluation for dialysis. Was seen by psychiatry to evaluate for ability to make own, informed decisions. After he was found to be competent, nephrology convinced him of need for dialysis. Patient underwent permcath placement along with fistula creation on 01/03/2017. Current electrolyte lab values wnl, with Na of 136, K 4.3, Cl 103. Current BUN of 130and Cr of 10.02, showing improvement following HD initiation. Nephrology will set up outpatient HD prior to discharge. Vascular surgery will not follow actively in the hospital and willsee him in the office in one month for office follow-up and duplex of his fistula. Patient currently getting HD treatment.  - appreciate vascular surgery recommendations - dialysis per nephrology, appreciate recommendations - Monitor volume status  closely - Daily weights, Strict I/Os - daily bmp  Uremia Improving with HD dialysis. Patient with very elevated BUN during admission. BUN 199 on admission. Increased to 211 on BMP on 7/23 prior to HD. Current BUN 130 following HD. Patient received two dialysis treatments. Nephrology planning for am HD. Patient is agreeable to outpatient HD. Patient with no s/s of encephalopathy and cardiomyopathy. Patient currently getting HD treatment.  - Dialysis per nephrology,appreciate their recs - monitor for s/s encephalopathy, cardiomyopathy  Hypoalbuminemia  Current prealbumin 23.5, showing adequate nutritional support. Likely due to protein calorie malnutrition from poor po intake prior to admission. Less likely causes include protein losing nephrotic syndrome and liver cirrhosis. Nutrition recommendations of no interventions warranted at current time. -Will need to maintain adequate protein for wound healing.  -no longer need to monitor prealbumin  -appreciate nutrition recommendations   Hypernatremia-resolved Resolved upon rehydration. Na currently 136.On admission with Na of 157. Prior to admissionhad little to no po intake for 5 days. -daily BMP -Nephrology recs  -monitor fluid status  Hypertension Resolved. BP currently 133/65. Had not been taking home meds for last 4-5 days prior to admission. BP on admission elevated to 174/97. Holding home lasix as patient is in acute renal failure 2/2 dehydration. - vitals per floor routine  - continue coreg 50mg  bid  - continue hydralazine 100mg  tid  - continue imdur 60mg  daily   HFrEF Echo from 2017 showing EF 35-40%. Will need to monitor EKG. Monitor for any s/s of fluid overload given necessity for iv hydration 2/2 dehydration. EKG showing possible left atrial enlargement, but patient is asymptomatic. - continue coreg - monitor cardiac status - continue to monitor daily weights - I's and O's  Skin Breakdown Patient soiled in own  urine and feces for 4 days. Areas of irritation in perineum and medial thighs. Patient denies tenderness or irritation to area, and states nursing care has improved breakdown. Will need to follow closely, no concern at this time for violation of dermis.Patient does not complain of irritation at this time -continue to monitor  -continue nursing care  Weakness Likely due to morbid obesity and increased with dehydration. Patient apparently unable to get up after bed broke for 4-5 days. Pt likely has sedentary lifestyle but concerning due to pts limited hx of social and living situation. Has been working with PT and OT showing improvement. OT recommendation of SNF placement with 3 in 1 bedside commode, tub/shower bench. PT recommendation of SNF and rolling walker with 5" wheels 3-in-1. Patient today states he does not desire SNF placement and would like home health instead. Patient would like assistance with transport to HD.  - appreciate PT recommendations  - appreciate OT recommendations  - will consult social work to discuss discharge needs   Hyperlipidemia Listed in problem list. Lipid panel from 04/2016 grossly normal aside from low hdl. Doesn't take any medication.  -Will likely need to follow this up as an outpatient.  Hyperglycemia-resolved  A1c on this admission 5.3. Patient with elevated glucose upon admission of 140. Current glucose 88. A1c in November 5.6.  - F/U blood glucose on daily bmp   Nausea-resolved Patient no longer complains of nausea.  -Phenergan 12.5 mg IV q6hrs prn  Right foot pain Patient complains of right foot pain in sole of right foot. Likely metatarsalalgia due to increased pain on ball of foot when ambulating. Unlikely plantar fasciitis due to no tenderness on palpation. Unlikely neuropathy given location of pain and lack of history of precipitating diseases such as diabetes.  -continue to monitor -encourage ambulation with PT as tolerated  -tylenol prn  for pain  -will likely need outpatient follow up with PCP and possible referral to sports medicine   FEN/GI: heart healthy, carb modified  PPx: SCDs, heparin   Disposition: awaiting CLIP, home with home health   Subjective:  Patient today was slightly guarded during exam. Patient denies pain, chest pain, SOB, or nausea. Patient states 6/10 foot pain, with increased pain in balls of foot during ambulation.   Objective: Temp:  [97.8 F (36.6 C)-98.8 F (37.1 C)] 97.8 F (36.6 C) (07/27 0701) Pulse Rate:  [64-69] 67 (07/27 0730) Resp:  [16-18] 16 (07/27 0730) BP: (113-148)/(60-79) 131/73 (07/27 0730) SpO2:  [96 %-100 %] 98 % (07/27 0730) Weight:  [288 lb 5.8 oz (130.8 kg)-295 lb 6.7 oz (134 kg)] 288 lb 5.8 oz (130.8 kg) (07/27 0650) Physical Exam: General: awake and alert, in HD getting dialysis, NAD Cardiovascular: RRR, no MRG Respiratory: CTAB, no wheezes, rales or rhonchi  Abdomen: soft, non tender, non distended, bowel sounds x 4 quadrants  Extremities: no edema, no tenderness to palpation. Patient retracts right foot when initially palpated, but denies pain when pressing on heel or sole of foot.   Laboratory:  Recent Labs Lab 01/03/17 1830 01/04/17 0300 01/05/17 2048  WBC 10.5 6.7 11.9*  HGB 8.8* 8.9* 8.6*  HCT 26.3* 26.8* 25.6*  PLT 133* 134* 212    Recent Labs Lab 01/04/17 0246  01/05/17 0744 01/05/17 2048 01/06/17 0338 01/07/17 0314  NA 137  < > 138 136 137 136  K 5.5*  < > 4.7 5.0 3.9 4.3  CL 110  < > 108 106 105  103  CO2 11*  < > 17* 15* 20* 16*  BUN 212*  < > 169* 171* 118* 130*  CREATININE 13.16*  < > 11.69* 11.84* 8.86* 10.02*  CALCIUM 7.2*  < > 7.1* 7.0* 7.4* 7.9*  PROT  --   --  6.2*  --   --   --   BILITOT  --   --  0.6  --   --   --   ALKPHOS  --   --  45  --   --   --   ALT 14*  --  12*  --   --   --   AST  --   --  13*  --   --   --   GLUCOSE 121*  < > 110* 117* 95 88  < > = values in this interval not displayed.   Imaging/Diagnostic  Tests: Dg Chest Port 1 View  Result Date: 01/03/2017 CLINICAL DATA:  56 year old male status post dialysis catheter placement EXAM: PORTABLE CHEST 1 VIEW COMPARISON:  Prior chest x-ray 04/19/2016 FINDINGS: Right IJ approach tunneled hemodialysis catheter. The catheter tip projects over the upper right atrium. Stable cardiomegaly and mild pulmonary vascular congestion without overt edema. Inspiratory volumes remain low. Probable small chronic right pleural effusion. No evidence pneumothorax. No acute osseous abnormality. IMPRESSION: 1. The tip of a right IJ approach tunneled hemodialysis catheter overlies the upper right atrium. 2. No evidence pneumothorax. 3. Stable cardiomegaly and vascular congestion without overt edema. Electronically Signed   By: Jacqulynn Cadet M.D.   On: 01/03/2017 15:37   Dg Fluoro Guide Cv Line-no Report  Result Date: 01/03/2017 Fluoroscopy was utilized by the requesting physician.  No radiographic interpretation.     Caroline More, DO 01/07/2017, 9:25 AM PGY-1, [place] Intern pager: [phone], text pages welcome

## 2017-01-08 DIAGNOSIS — E785 Hyperlipidemia, unspecified: Secondary | ICD-10-CM

## 2017-01-08 DIAGNOSIS — I10 Essential (primary) hypertension: Secondary | ICD-10-CM

## 2017-01-08 LAB — BASIC METABOLIC PANEL
Anion gap: 12 (ref 5–15)
BUN: 81 mg/dL — AB (ref 6–20)
CHLORIDE: 99 mmol/L — AB (ref 101–111)
CO2: 23 mmol/L (ref 22–32)
Calcium: 8.8 mg/dL — ABNORMAL LOW (ref 8.9–10.3)
Creatinine, Ser: 7.78 mg/dL — ABNORMAL HIGH (ref 0.61–1.24)
GFR calc Af Amer: 8 mL/min — ABNORMAL LOW (ref >60)
GFR calc non Af Amer: 7 mL/min — ABNORMAL LOW (ref >60)
GLUCOSE: 94 mg/dL (ref 65–99)
POTASSIUM: 4.2 mmol/L (ref 3.5–5.1)
SODIUM: 134 mmol/L — AB (ref 135–145)

## 2017-01-08 NOTE — Progress Notes (Signed)
**Edward Edward** Edward Edward   Subjective:   56 yo M with progressive CKD5, now ESRD. Admitted 7/19 with severe dehydration and hypernatremia after being found down x 5 days covered in urine and feces. Improved with IVFs. Initially declined HD, however now willing to proceed. Tolerating dialysis and now s/p 3 HD sessions    Objective:  Vital signs in last 24 hours:  Temp:  [98.2 F (36.8 C)-99.2 F (37.3 C)] 98.2 F (36.8 C) (07/28 0554) Pulse Rate:  [64-73] 65 (07/28 0554) Resp:  [16-18] 18 (07/28 0554) BP: (110-136)/(45-83) 117/59 (07/28 0554) SpO2:  [98 %-100 %] 99 % (07/28 0554) Weight:  [277 lb 5.4 oz (125.8 kg)-280 lb (127 kg)] 280 lb (127 kg) (07/28 0554)  Weight change: -18 lb 1.2 oz (-8.2 kg) Filed Weights   01/07/17 0650 01/07/17 1002 01/08/17 0554  Weight: 288 lb 5.8 oz (130.8 kg) 277 lb 5.4 oz (125.8 kg) 280 lb (127 kg)    Intake/Output: I/O last 3 completed shifts: In: 710 [P.O.:710] Out: 3708 [Urine:680; IWLNL:8921]   Intake/Output this shift:  No intake/output data recorded.  GEN: Obese, NAD CV: RRR PULM: CTAB ABD: Soft, non tender, normal BS EXT: Warm and well perfused, no edema, no asterixis. LUE fistula with palpable thrill, radial pulse 2+.      Basic Metabolic Panel:  Recent Labs Lab 01/02/17 1233  01/03/17 1830  01/05/17 0744 01/05/17 2048 01/06/17 0338 01/07/17 0314 01/08/17 0418  NA  --   < > 138  < > 138 136 137 136 134*  K  --   < > 5.5*  < > 4.7 5.0 3.9 4.3 4.2  CL  --   < > 113*  < > 108 106 105 103 99*  CO2  --   < > 10*  < > 17* 15* 20* 16* 23  GLUCOSE  --   < > 127*  < > 110* 117* 95 88 94  BUN  --   < > 212*  < > 169* 171* 118* 130* 81*  CREATININE  --   < > 13.09*  < > 11.69* 11.84* 8.86* 10.02* 7.78*  CALCIUM  --   < > 7.2*  < > 7.1* 7.0* 7.4* 7.9* 8.8*  PHOS 7.8*  --  9.6*  --   --  9.8*  --   --   --   < > = values in this interval not displayed.  Liver Function Tests:  Recent Labs Lab 01/03/17 1830  01/04/17 0246 01/05/17 0744 01/05/17 2048  AST  --   --  13*  --   ALT  --  14* 12*  --   ALKPHOS  --   --  45  --   BILITOT  --   --  0.6  --   PROT  --   --  6.2*  --   ALBUMIN 2.5*  --  2.7* 2.8*   No results for input(s): LIPASE, AMYLASE in the last 168 hours. No results for input(s): AMMONIA in the last 168 hours.  CBC:  Recent Labs Lab 01/02/17 0544 01/03/17 1830 01/04/17 0300 01/05/17 2048 01/07/17 1049  WBC 9.9 10.5 6.7 11.9* 9.3  HGB 8.9* 8.8* 8.9* 8.6* 9.4*  HCT 26.2* 26.3* 26.8* 25.6* 28.3*  MCV 79.9 80.4 81.0 80.0 81.1  PLT 120* 133* 134* 212 234    Cardiac Enzymes: No results for input(s): CKTOTAL, CKMB, CKMBINDEX, TROPONINI in the last 168 hours.  BNP: Invalid input(s): POCBNP  CBG:  No results for input(s): GLUCAP in the last 168 hours.  Microbiology: Results for orders placed or performed during the hospital encounter of 12/30/16  Surgical pcr screen     Status: Abnormal   Collection Time: 01/03/17  4:56 AM  Result Value Ref Range Status   MRSA, PCR NEGATIVE NEGATIVE Final   Staphylococcus aureus POSITIVE (A) NEGATIVE Final    Comment:        The Xpert SA Assay (FDA approved for NASAL specimens in patients over 51 years of age), is one component of a comprehensive surveillance program.  Test performance has been validated by [facility] for patients greater than or equal to 51 year old. It is not intended to diagnose infection nor to guide or monitor treatment.     Coagulation Studies: No results for input(s): LABPROT, INR in the last 72 hours.  Urinalysis: No results for input(s): COLORURINE, LABSPEC, PHURINE, GLUCOSEU, HGBUR, BILIRUBINUR, KETONESUR, PROTEINUR, UROBILINOGEN, NITRITE, LEUKOCYTESUR in the last 72 hours.  Invalid input(s): APPERANCEUR    Imaging: No results found.   Medications:   . sodium chloride    . sodium chloride    . sodium chloride    . sodium chloride    . sodium chloride     . aspirin EC  81 mg  Oral Daily  . calcitRIOL  0.25 mcg Oral Daily  . calcium acetate  1,334 mg Oral TID WC  . carvedilol  50 mg Oral BID WC  . Chlorhexidine Gluconate Cloth  6 each Topical Daily  . heparin  5,000 Units Subcutaneous Q8H  . hydrALAZINE  100 mg Oral TID  . isosorbide mononitrate  60 mg Oral Daily  . mupirocin ointment  1 application Nasal BID   sodium chloride, sodium chloride, sodium chloride, sodium chloride, acetaminophen **OR** acetaminophen, alteplase, heparin, heparin, lidocaine (PF), lidocaine-prilocaine, oxyCODONE-acetaminophen, pentafluoroprop-tetrafluoroeth, polyethylene glycol, promethazine  Assessment/ Plan:  1. ESRD: S/p right IJ TDC and left radiocephalic AVF per [facility] on 5/97. Intermittent HD initiated 7/23, s/p 3 sessions.  2. Hypernatremia: Resolved with IVFs 3. HTN: Well controlled now, on coreg, hydralazine, and imdur      LOS: 8 Edward Edward @TODAY @7 :52 AM

## 2017-01-08 NOTE — Progress Notes (Signed)
Family Medicine Teaching Service Daily Progress Note Intern Pager: [phone]  Patient name: Edward Jordan Medical record number: [id number] Date of birth: 11-22-1960 Age: 56 y.o. Gender: male  Primary Care Provider: Patient, No Pcp Per Consultants: Nephrology, Psychiatry, PT/OT, Nutrition, vascular surgery Code Status: Full   Pt Overview and Major Events to Date:  07/20 admitted to [facility] 07/23 Permcath placement, L radiocephalic AVF fistula creation 07/24 began HD  Assessment and Plan: Edward Jordan a 56 y.o.malepresenting with 5 day history of poor po intake. On admission found to be hypernatremia, dehydrated, and AKI. PMH is significant for HTN, CKD, HLD, morbid obesity, HFrEF   Dehydration Resolved. Likely 2/2 inadequate PO intake for 5d prior to admission in setting of known CKD.  Current electrolyte lab values wnl, with Na of 134, K 4.2, Cl 99. Tolerating PO well.  - Monitor volume status closely - Daily BMP  ESRD on HD: Initially refusing, but decided to begin HD while inpatient. Percath placed 07/23 as well as AVF creation; HD started subsequent day. Current BUN of 81and Cr of 7.8, showing improvement following HD initiation. No signs of uremia. Patient awaiting CLIP prior to discharge and receiving intermittent HD in meantime.  - Nephro following, appreciate recs - F/u with vascular surgery outpatient in one month - Strict I/Os - Daily weights - AM BMP  Hypoalbuminemia  Current prealbumin 23.5, showing adequate nutritional support. Likely due to protein calorie malnutrition from poor po intake prior to admission. Less likely causes include protein losing nephrotic syndrome and liver cirrhosis. Nutrition recommendations of no interventions warranted at current time. - Will need to maintain adequate protein for wound healing.  - Appreciate nutrition recommendations   Hypernatremia-resolved Likely 2/2 dehydration. Resolved upon rehydration.On admission with Na of 157. Na 134  this AM. - AM BMP  Hypertension Had not been taking home meds for last 4-5 days prior to admission. BP on admission elevated to 174/97. Holding home lasix as patient is in acute renal failure 2/2 dehydration. Slightly hypotensive overnight at 117/59.  - Vitals per floor routine  - Continue coreg 50mg  bid  - Continue hydralazine 100mg  tid  - Continue imdur 60mg  daily   HFrEF Echo from 2017 showing EF 35-40%. EKG showing possible left atrial enlargement, but patient is asymptomatic. No signs of fluid overload on exam. Volume status being managed with HD.  - Continue coreg - Monitor cardiac status - Continue to monitor daily weights - I's and O's  Skin Breakdown Patient soiled in own urine and feces for 4 days. Areas of irritation in perineum and medial thighs. Patient denies tenderness or irritation to area, and states nursing care has improved breakdown. Will need to follow closely, no concern at this time for violation of dermis.Patient does not complain of irritation at this time - Continue to monitor  - Continue nursing care  Weakness Likely due to morbid obesity and increased with dehydration. Patient apparently unable to get up after bed broke for 4-5 days. Pt likely has sedentary lifestyle but concerning due to pts limited hx of social and living situation. Has been working with PT and OT showing improvement. OT recommendation of SNF placement with 3 in 1 bedside commode, tub/shower bench. PT recommendation of SNF and rolling walker with 5" wheels 3-in-1. Declining declining SNF.  - Continue to work with PT/OT while inpatient - CSW involved to help set up outpatient needs upon discharge  Hyperlipidemia Listed in problem list. Lipid panel from 04/2016 grossly normal aside from low HDL. Not  currently medically treated. - F/u outpatient with PCP  Hyperglycemia-resolved  A1c on this admission 5.3. Patient with elevated glucose upon admission of 140. CBG 94 this AM.  - F/U  blood glucose on daily bmp   Nausea-resolved -Phenergan 12.5 mg IV q6hrs prn  Right foot pain Patient complains of right foot pain in sole of right foot. Likely metatarsalalgia due to increased pain on ball of foot when ambulating. Unlikely plantar fasciitis due to no tenderness on palpation. Unlikely neuropathy given location of pain and lack of history of precipitating diseases such as diabetes.  - Continue to monitor - Encourage ambulation with PT as tolerated  - Tylenol prn for pain  - Will likely need outpatient follow up with PCP and possible referral to sports medicine   FEN/GI: heart healthy, carb modified  PPx: SCDs, heparin   Disposition: awaiting CLIP, home with home health   Subjective:  No acute events overnight. No complaints this AM.   Objective: Temp:  [97.8 F (36.6 C)-99.2 F (37.3 C)] 98.2 F (36.8 C) (07/28 0554) Pulse Rate:  [64-73] 65 (07/28 0554) Resp:  [16-18] 18 (07/28 0554) BP: (110-148)/(45-83) 117/59 (07/28 0554) SpO2:  [97 %-100 %] 99 % (07/28 0554) Weight:  [277 lb 5.4 oz (125.8 kg)-288 lb 5.8 oz (130.8 kg)] 280 lb (127 kg) (07/28 0554) Physical Exam: General: sleeping comfortably in bedside chair, in NAD Cardiovascular: RRR, no murmurs appreciated Respiratory: CTAB, no wheezes, normal WOB on RA Abdomen: soft, non tender, non distended, +BS Extremities: no LE edema Neuro: A&Ox3 Psych: flat affect  Laboratory:  Recent Labs Lab 01/04/17 0300 01/05/17 2048 01/07/17 1049  WBC 6.7 11.9* 9.3  HGB 8.9* 8.6* 9.4*  HCT 26.8* 25.6* 28.3*  PLT 134* 212 234    Recent Labs Lab 01/04/17 0246  01/05/17 0744  01/06/17 0338 01/07/17 0314 01/08/17 0418  NA 137  < > 138  < > 137 136 134*  K 5.5*  < > 4.7  < > 3.9 4.3 4.2  CL 110  < > 108  < > 105 103 99*  CO2 11*  < > 17*  < > 20* 16* 23  BUN 212*  < > 169*  < > 118* 130* 81*  CREATININE 13.16*  < > 11.69*  < > 8.86* 10.02* 7.78*  CALCIUM 7.2*  < > 7.1*  < > 7.4* 7.9* 8.8*  PROT  --   --   6.2*  --   --   --   --   BILITOT  --   --  0.6  --   --   --   --   ALKPHOS  --   --  45  --   --   --   --   ALT 14*  --  12*  --   --   --   --   AST  --   --  13*  --   --   --   --   GLUCOSE 121*  < > 110*  < > 95 88 94  < > = values in this interval not displayed.   Imaging/Diagnostic Tests: Dg Chest Port 1 View  Result Date: 01/03/2017 CLINICAL DATA:  56 year old male status post dialysis catheter placement EXAM: PORTABLE CHEST 1 VIEW COMPARISON:  Prior chest x-ray 04/19/2016 FINDINGS: Right IJ approach tunneled hemodialysis catheter. The catheter tip projects over the upper right atrium. Stable cardiomegaly and mild pulmonary vascular congestion without overt edema. Inspiratory volumes remain low. Probable  small chronic right pleural effusion. No evidence pneumothorax. No acute osseous abnormality. IMPRESSION: 1. The tip of a right IJ approach tunneled hemodialysis catheter overlies the upper right atrium. 2. No evidence pneumothorax. 3. Stable cardiomegaly and vascular congestion without overt edema. Electronically Signed   By: Jacqulynn Cadet M.D.   On: 01/03/2017 15:37   Dg Fluoro Guide Cv Line-no Report  Result Date: 01/03/2017 Fluoroscopy was utilized by the requesting physician.  No radiographic interpretation.     Verner Mould, MD 01/08/2017, 6:49 AM PGY-3, [place] Intern pager: [phone], text pages welcome

## 2017-01-09 DIAGNOSIS — N186 End stage renal disease: Secondary | ICD-10-CM

## 2017-01-09 DIAGNOSIS — Z95828 Presence of other vascular implants and grafts: Secondary | ICD-10-CM

## 2017-01-09 DIAGNOSIS — R238 Other skin changes: Secondary | ICD-10-CM | POA: Diagnosis present

## 2017-01-09 DIAGNOSIS — L909 Atrophic disorder of skin, unspecified: Secondary | ICD-10-CM

## 2017-01-09 DIAGNOSIS — Z992 Dependence on renal dialysis: Secondary | ICD-10-CM

## 2017-01-09 DIAGNOSIS — R531 Weakness: Secondary | ICD-10-CM

## 2017-01-09 LAB — BASIC METABOLIC PANEL
Anion gap: 14 (ref 5–15)
BUN: 99 mg/dL — AB (ref 6–20)
CALCIUM: 8.8 mg/dL — AB (ref 8.9–10.3)
CO2: 22 mmol/L (ref 22–32)
CREATININE: 9.59 mg/dL — AB (ref 0.61–1.24)
Chloride: 96 mmol/L — ABNORMAL LOW (ref 101–111)
GFR calc non Af Amer: 5 mL/min — ABNORMAL LOW (ref >60)
GFR, EST AFRICAN AMERICAN: 6 mL/min — AB (ref >60)
GLUCOSE: 109 mg/dL — AB (ref 65–99)
Potassium: 4.1 mmol/L (ref 3.5–5.1)
Sodium: 132 mmol/L — ABNORMAL LOW (ref 135–145)

## 2017-01-09 MED ORDER — HYDRALAZINE HCL 50 MG PO TABS
50.0000 mg | ORAL_TABLET | Freq: Three times a day (TID) | ORAL | Status: DC
  Administered 2017-01-09 – 2017-01-10 (×2): 50 mg via ORAL
  Filled 2017-01-09 (×3): qty 1

## 2017-01-09 NOTE — Progress Notes (Signed)
[facility] ROUNDING NOTE   Subjective:   56 yo M with progressive CKD5, now ESRD. Admitted 7/19 with severe dehydration and hypernatremia after being found down x 5 days covered in urine and feces. Improved with IVFs. Initially declined HD, however now willing to proceed. Tolerating dialysis and now s/p 3 HD sessions   Has outpatient slot at [facility]     Objective:  Vital signs in last 24 hours:  Temp:  [98.4 F (36.9 C)-98.5 F (36.9 C)] 98.4 F (36.9 C) (07/29 0508) Pulse Rate:  [62-64] 62 (07/29 0508) Resp:  [18] 18 (07/29 0508) BP: (105-109)/(50-58) 105/50 (07/29 0508) SpO2:  [100 %] 100 % (07/29 0508)  Weight change:  Filed Weights   01/07/17 0650 01/07/17 1002 01/08/17 0554  Weight: 288 lb 5.8 oz (130.8 kg) 277 lb 5.4 oz (125.8 kg) 280 lb (127 kg)    Intake/Output: I/O last 3 completed shifts: In: 690 [P.O.:690] Out: 180 [Urine:180]   Intake/Output this shift:  No intake/output data recorded.  CVS- RRR RS- CTA ABD- BS present soft non-distended EXT- no edema   Basic Metabolic Panel:  Recent Labs Lab 01/02/17 1233  01/03/17 1830  01/05/17 2048 01/06/17 0338 01/07/17 0314 01/08/17 0418 01/09/17 0419  NA  --   < > 138  < > 136 137 136 134* 132*  K  --   < > 5.5*  < > 5.0 3.9 4.3 4.2 4.1  CL  --   < > 113*  < > 106 105 103 99* 96*  CO2  --   < > 10*  < > 15* 20* 16* 23 22  GLUCOSE  --   < > 127*  < > 117* 95 88 94 109*  BUN  --   < > 212*  < > 171* 118* 130* 81* 99*  CREATININE  --   < > 13.09*  < > 11.84* 8.86* 10.02* 7.78* 9.59*  CALCIUM  --   < > 7.2*  < > 7.0* 7.4* 7.9* 8.8* 8.8*  PHOS 7.8*  --  9.6*  --  9.8*  --   --   --   --   < > = values in this interval not displayed.  Liver Function Tests:  Recent Labs Lab 01/03/17 1830 01/04/17 0246 01/05/17 0744 01/05/17 2048  AST  --   --  13*  --   ALT  --  14* 12*  --   ALKPHOS  --   --  45  --   BILITOT  --   --  0.6  --   PROT  --   --  6.2*  --    ALBUMIN 2.5*  --  2.7* 2.8*   No results for input(s): LIPASE, AMYLASE in the last 168 hours. No results for input(s): AMMONIA in the last 168 hours.  CBC:  Recent Labs Lab 01/03/17 1830 01/04/17 0300 01/05/17 2048 01/07/17 1049  WBC 10.5 6.7 11.9* 9.3  HGB 8.8* 8.9* 8.6* 9.4*  HCT 26.3* 26.8* 25.6* 28.3*  MCV 80.4 81.0 80.0 81.1  PLT 133* 134* 212 234    Cardiac Enzymes: No results for input(s): CKTOTAL, CKMB, CKMBINDEX, TROPONINI in the last 168 hours.  BNP: Invalid input(s): POCBNP  CBG: No results for input(s): GLUCAP in the last 168 hours.  Microbiology: Results for orders placed or performed during the hospital encounter of 12/30/16  Surgical pcr screen     Status: Abnormal   Collection Time: 01/03/17  4:56  AM  Result Value Ref Range Status   MRSA, PCR NEGATIVE NEGATIVE Final   Staphylococcus aureus POSITIVE (A) NEGATIVE Final    Comment:        The Xpert SA Assay (FDA approved for NASAL specimens in patients over 51 years of age), is one component of a comprehensive surveillance program.  Test performance has been validated by [facility] for patients greater than or equal to 68 year old. It is not intended to diagnose infection nor to guide or monitor treatment.     Coagulation Studies: No results for input(s): LABPROT, INR in the last 72 hours.  Urinalysis: No results for input(s): COLORURINE, LABSPEC, PHURINE, GLUCOSEU, HGBUR, BILIRUBINUR, KETONESUR, PROTEINUR, UROBILINOGEN, NITRITE, LEUKOCYTESUR in the last 72 hours.  Invalid input(s): APPERANCEUR    Imaging: No results found.   Medications:   . sodium chloride    . sodium chloride    . sodium chloride    . sodium chloride    . sodium chloride     . aspirin EC  81 mg Oral Daily  . calcitRIOL  0.25 mcg Oral Daily  . calcium acetate  1,334 mg Oral TID WC  . carvedilol  50 mg Oral BID WC  . heparin  5,000 Units Subcutaneous Q8H  . hydrALAZINE  100 mg Oral TID  . isosorbide  mononitrate  60 mg Oral Daily   sodium chloride, sodium chloride, sodium chloride, sodium chloride, acetaminophen **OR** acetaminophen, alteplase, heparin, heparin, lidocaine (PF), lidocaine-prilocaine, oxyCODONE-acetaminophen, pentafluoroprop-tetrafluoroeth, polyethylene glycol, promethazine   A/P  1. ESRD: S/p right IJ TDC and left radiocephalic AVF per [facility] on 8/00. Intermittent HD initiated 7/23, s/p 3 sessions. MWF dialysis and will continue. [facility] .  2. Hypernatremia: Resolved with IVFs 3. HTN: Well controlled now, on coreg, hydralazine, and imdur    LOS: 9 [place] W @TODAY @9 :09 AM

## 2017-01-09 NOTE — Progress Notes (Signed)
Family Medicine Teaching Service Daily Progress Note Intern Pager: [phone]  Patient name: Edward Jordan Medical record number: [id number] Date of birth: [birth year] Age: 56 y.o. Gender: male  Primary Care Provider: Patient, No Pcp Per Consultants: Nephrology, Psychiatry, Nutrition, PT, OT Code Status: Full   Pt Overview and Major Events to Date:  07/20 admitted to [facility] 07/23 Permcath placement, L radiocephalic AVF fistula creation 07/24 began HD  Assessment and Plan: Richardson Dubree a 56 y.o.malepresenting with 5 day history of poor po intake. On admission found to be hypernatremia, dehydrated, and AKI. PMH is significant for HTN, CKD, HLD, morbid obesity, HFrEF   Dehydration-resolved Resolved. Likely 2/2 inadequate PO intake for 5d prior to admission in setting of known CKD.  Current electrolyte lab values wnl, with Na of 134, K 4.2, Cl 99. Tolerating PO well.  - Monitor volume status closely - Daily BMP  ESRD on HD:  Initially refusing, but decided to begin HD while inpatient. Percath placed 07/23 as well as AVF creation; HD started subsequent day. Current BUN of 99and Cr of 9.59, showing improvement following HD initiation. No signs of uremia. Patient awaiting CLIP prior to discharge and receiving intermittent HD in meantime. HD ordered for today. Patient has outpatient dialysis scheduled at [facility] for TTS.  - Nephro following, appreciate recs - F/u with vascular surgery outpatient in one month - Strict I/Os - Daily weights - AM BMP  Hypoalbuminemia  Previous prealbumin levels showing adequate nutritional support. Likely due to protein calorie malnutrition from poor po intake prior to admission. Less likely causes include protein losing nephrotic syndrome and liver cirrhosis. Nutrition recommendations of no interventions warranted at current time. - Will need to maintain adequate protein for wound healing.  - Appreciate nutrition recommendations    Hypernatremia-resolved Likely 2/2 dehydration. Resolved upon rehydration.On admission with Na of 157. Na 132 this AM. - AM BMP  Hypertension Had not been taking home meds for last 4-5 days prior to admission. BP on admission elevated to 174/97. Holding home lasix as patient is in acute renal failure 2/2 dehydration. Current BP of 105/50.  - Vitals per floor routine  - Continue coreg 50mg  bid  - Continue hydralazine 100mg  tid  - Continue imdur 60mg  daily   HFrEF Echo from 2017 showing EF 35-40%. EKG showing possible left atrial enlargement, but patient is asymptomatic. No signs of fluid overload on exam. Volume status being managed with HD. HD ordered for today. Nephrology recommendations for outpatient slot at [place] coreg - Monitor cardiac status - Continue to monitor daily weights - I's and O's  Skin Breakdown Patient soiled in own urine and feces for 4 days. Areas of irritation in perineum and medial thighs. Patient denies tenderness or irritation to area, and states nursing care has improved breakdown. Will need to follow closely, no concern at this time for violation of dermis.Patient does not complain of irritation at this time - Continue to monitor  - Continue nursing care  Weakness Likely due to morbid obesity and increased with dehydration. Patient apparently unable to get up after bed broke for 4-5 days. Pt likely has sedentary lifestyle but concerning due to pts limited hx of social and living situation. Has been working with PT and OT showing improvement. OT recommendation of SNF placement with 3 in 1 bedside commode, tub/shower bench. PT recommendation of SNFand rolling walker with 5" wheels 3-in-1. Declining declining SNF.  - Continue to work with PT/OT while inpatient -  CSW involved to help set up outpatient needs upon discharge  Hyperlipidemia Listed in problem list. Lipid panel from 04/2016 grossly normal aside from low  HDL. Not currently medically treated. - F/u outpatient with PCP  Hyperglycemia-resolved  A1c on this admission 5.3. Patient with elevated glucose upon admission of 140. Glucose of 109 on BMP this am.  - F/U blood glucose on daily bmp   Nausea-resolved -Phenergan 12.5 mg IV q6hrs prn  Right foot pain Patient complains of right foot pain in sole of right foot. Likely metatarsalalgia due to increased pain on ball of foot when ambulating. Unlikely plantar fasciitis due to no tenderness on palpation. Unlikely neuropathy given location of pain and lack of history of precipitating diseases such as diabetes. Patient states pain has improved, denies pain on palpation.  - Continue to monitor - Encourage ambulation with PT as tolerated  - Tylenol prn for pain  - Will likely need outpatient follow up with PCP and possible referral to sports medicine   FEN/GI: heart healthy, carb modified  PPx: SCDs, heparin   Disposition: home with home health, outpatient dialysis at [facility]   Subjective:  Patient today states he is feeling better. Denies pain, chest pain, SOB, cramping, nausea, or vomiting. States his rash is improving and nursing care has helped. Patient states he feels improved after HD dialysis and will continue as outpatient. Patient states he has a good appetite and is able to tolerate po intake.   Objective: Temp:  [97.7 F (36.5 C)-98.5 F (36.9 C)] 97.7 F (36.5 C) (07/29 1056) Pulse Rate:  [62-65] 65 (07/29 1056) Resp:  [18] 18 (07/29 0508) BP: (105-119)/(50-65) 119/65 (07/29 1056) SpO2:  [100 %] 100 % (07/29 1056) Physical Exam: General: awake and alert, sitting up in chair eating breakfast and watching tv  Cardiovascular: RRR, no MRG  Respiratory: CTAB, no wheezes, rales, or rhonchi  Abdomen: soft, non tender, non distended  Extremities: no edema, non tender, left radiocephalic AVF shows no signs of infection, IJ TDC shows no signs of infection    Laboratory:  Recent Labs Lab 01/04/17 0300 01/05/17 2048 01/07/17 1049  WBC 6.7 11.9* 9.3  HGB 8.9* 8.6* 9.4*  HCT 26.8* 25.6* 28.3*  PLT 134* 212 234    Recent Labs Lab 01/04/17 0246  01/05/17 0744  01/07/17 0314 01/08/17 0418 01/09/17 0419  NA 137  < > 138  < > 136 134* 132*  K 5.5*  < > 4.7  < > 4.3 4.2 4.1  CL 110  < > 108  < > 103 99* 96*  CO2 11*  < > 17*  < > 16* 23 22  BUN 212*  < > 169*  < > 130* 81* 99*  CREATININE 13.16*  < > 11.69*  < > 10.02* 7.78* 9.59*  CALCIUM 7.2*  < > 7.1*  < > 7.9* 8.8* 8.8*  PROT  --   --  6.2*  --   --   --   --   BILITOT  --   --  0.6  --   --   --   --   ALKPHOS  --   --  45  --   --   --   --   ALT 14*  --  12*  --   --   --   --   AST  --   --  13*  --   --   --   --  GLUCOSE 121*  < > 110*  < > 88 94 109*  < > = values in this interval not displayed.    Imaging/Diagnostic Tests: Dg Chest Port 1 View  Result Date: 01/03/2017 CLINICAL DATA:  56 year old male status post dialysis catheter placement EXAM: PORTABLE CHEST 1 VIEW COMPARISON:  Prior chest x-ray 04/19/2016 FINDINGS: Right IJ approach tunneled hemodialysis catheter. The catheter tip projects over the upper right atrium. Stable cardiomegaly and mild pulmonary vascular congestion without overt edema. Inspiratory volumes remain low. Probable small chronic right pleural effusion. No evidence pneumothorax. No acute osseous abnormality. IMPRESSION: 1. The tip of a right IJ approach tunneled hemodialysis catheter overlies the upper right atrium. 2. No evidence pneumothorax. 3. Stable cardiomegaly and vascular congestion without overt edema. Electronically Signed   By: Jacqulynn Cadet M.D.   On: 01/03/2017 15:37   Dg Fluoro Guide Cv Line-no Report  Result Date: 01/03/2017 Fluoroscopy was utilized by the requesting physician.  No radiographic interpretation.     Caroline More, DO 01/09/2017, 4:30 PM PGY-1, [place] Intern pager: [phone], text  pages welcome

## 2017-01-10 ENCOUNTER — Telehealth: Payer: Self-pay

## 2017-01-10 MED ORDER — CALCIUM ACETATE (PHOS BINDER) 667 MG PO CAPS: 1334.0000 mg | ORAL_CAPSULE | Freq: Three times a day (TID) | ORAL | 0 refills | Status: AC

## 2017-01-10 NOTE — Care Management Note (Signed)
Case Management Note  Patient Details  Name: Edward Jordan MRN: [id number] Date of Birth: 1961/06/01  Subjective/Objective:          Admitted with  progressive CKD5, now ESRD.           Action/Plan: Plan is to d/c to home today. Pt refused SNF per PT's recommendations. Pt states will take care of self once d/c. Home health PT/OT ordered for pt , however, pt doesn't have insurance and pt states can't afford out of packet cost for services, $195.00/ session.   [facility] with [facility] slot [facility].     Pt states friend will provide transportation to home  Expected Discharge Date:  01/10/17               Expected Discharge Plan:     In-House Referral:     Discharge planning Services  CM Consult, [facility]  Post Acute Care Choice:  Durable Medical Equipment Choice offered to:  Patient  DME Arranged:  [facility] rolling DME Agency:  [place]:    [facility] Agency:     Status of Service:  Completed, signed off  If discussed at [place] of Stay Meetings, dates discussed:    Additional Comments:  Sharin Mons, RN 01/10/2017, 12:36 PM

## 2017-01-10 NOTE — Progress Notes (Signed)
CSW completed SCAT application with patient for help with transportation to dialysis.   CSW signing off.  Percell Locus Alf Doyle LCSWA [phone]

## 2017-01-10 NOTE — Progress Notes (Signed)
Patient discharged to home with home health around 1820 via friend's car. Patient stable, discharge teaching provided to patient who verbalized understanding. IV removed and intact. Skin intact besides some MASD on groin and abdomen.   Markus Jarvis, RN

## 2017-01-10 NOTE — Discharge Instructions (Signed)
You were admitted for dehydration.   Schedule an appointment with your regular doctor within one week of leaving the hospital.   Please continue outpatient dialysis on Tues/Thurs/Saturday schedule at [facility]   Please follow up with vascular surgery

## 2017-01-10 NOTE — Telephone Encounter (Signed)
Message received from Whitman Hero, RN CM. Informed her that the patient would not qualify for TCC but he has a follow up appointment at [facility] on 01/19/17.  This CM can call the patient this week to discuss community resource needs and explain services provided at [facility] including pharmacy assistance, financial counseling and social work.

## 2017-01-10 NOTE — Progress Notes (Signed)
Family Medicine Teaching Service Daily Progress Note Intern Pager: [phone]  Patient name: Edward Jordan Medical record number: [id number] Date of birth: 06/14/1961 Age: 56 y.o. Gender: male  Primary Care Provider: Patient, No Pcp Per Consultants: Nephrology, Psychiatry, Nutrition, PT, OT Code Status: Full  Pt Overview and Major Events to Date:  07/20 admitted to [facility] 07/23 Permcath placement, L radiocephalic AVF fistula creation 07/24 began HD  Assessment and Plan: Edward Jordan a 56 y.o.malepresenting with 5 day history of poor po intake. On admission found to be hypernatremia, dehydrated, and AKI. PMH is significant for HTN, CKD, HLD, morbid obesity, HFrEF   Dehydration-resolved Resolved. Likely 2/2 inadequate PO intake for 5d prior to admission in setting of known CKD. Current electrolyte lab values wnl, with Na of 132, K 4.1, Cl 96.Tolerating PO well.  - Monitor volume status closely - Daily BMP  ESRD on HD:  Initially refusing, but decided to begin HD while inpatient. Permcath placed 07/23 as well as AVF creation; HD started subsequent day. Current BUN of 99and Cr of 9.59, showing improvement following HD initiation. No signs of uremia. Patient awaiting CLIP prior to discharge and receiving intermittent HD in meantime. Per nephrology note, patient will not received dialysis on 7/30 and ok for discharge with HD as outpatient tomorrow. Patient has outpatient dialysis scheduled at [facility] for TTS. Patient agreeable to outpatient HD.  - Nephro following, appreciate recs - F/u with vascular surgery outpatient in one month - Strict I/Os - Daily weights - AM BMP  Hypoalbuminemia-resolved   Previous prealbumin levels showing adequate nutritional support. Likely due to protein calorie malnutrition from poor po intake prior to admission. Less likely causes include protein losing nephrotic syndrome and liver cirrhosis. Nutrition recommendations of no interventions  warranted at current time. - Will need to maintain adequate protein for wound healing.  - Appreciate nutrition recommendations   Hypernatremia-resolved Likely 2/2 dehydration. Resolved upon rehydration.On admission with Na of 157. Na 132 this AM. - AM BMP  Hypertension Had not been taking home meds for last 4-5 days prior to admission. BP on admission elevated to 174/97. Holding home lasix as patient is in acute renal failure 2/2 dehydration. Current BP of 112/65.  - Vitals per floor routine  - Continue coreg 50mg  bid  - Continue hydralazine 100mg  tid  - Continue imdur 60mg  daily   HFrEF Echo from 2017 showing EF 35-40%. EKG showing possible left atrial enlargement, but patient is asymptomatic. No signs of fluid overload on exam. Volume status being managed with HD. HD ordered for today. Nephrology recommendations for outpatient slot at [place] coreg - Monitor cardiac status - Continue to monitor daily weights - I's and O's  Skin Breakdown Patient soiled in own urine and feces for 4 days. Areas of irritation in perineum and medial thighs. Patient denies tenderness or irritation to area, and states nursing care has improved breakdown. Will need to follow closely, no concern at this time for violation of dermis.Patient does not complain of irritation at this time - Continue to monitor  - Continue nursing care  Weakness Likely due to morbid obesity and increased with dehydration. Patient apparently unable to get up after bed broke for 4-5 days. Pt likely has sedentary lifestyle but concerning due to pts limited hx of social and living situation. Has been working with PT and OT showing improvement. OT recommendation of SNF placement with 3 in 1 bedside commode, tub/shower bench. PT recommendation of SNFand rolling  walker with 5" wheels 3-in-1. Patient declining SNF and would prefer home health.  - Continue to work with PT/OT while inpatient - CSW  involved to help set up outpatient needs upon discharge  Hyperlipidemia Listed in problem list. Lipid panel from 04/2016 grossly normal aside from low HDL. Not currently medically treated. - F/u outpatient with PCP  Hyperglycemia-resolved  A1c on this admission 5.3. Patient with elevated glucose upon admission of 140. Glucose of 109 on BMP this am.  - F/U blood glucose on daily bmp   Nausea-resolved -Phenergan 12.5 mg IV q6hrs prn  Right foot pain Improving. Patient complains of right foot pain in sole of right foot. Likely metatarsalalgia due to increased pain on ball of foot when ambulating. Unlikely plantar fasciitis due to no tenderness on palpation. Unlikely neuropathy given location of pain and lack of history of precipitating diseases such as diabetes. Patient states pain has improved, denies pain on palpation.  - Continue to monitor - Encourage ambulation with PT as tolerated  - Tylenol prn for pain  - Will likely need outpatient follow up with PCP and possible referral to sports medicine    FEN/GI: heart healthy, carb modified  PPx: SCDs, heparin   Disposition: discharge home on 01/10/2017  Subjective:  Patient today states he is improving. States he would like to talk to social work regarding disability. Patient denies pain, CP, SOB, n/v/d. Patient states his foot pain has improved and is walking with PT.   Objective: Temp:  [98.4 F (36.9 C)-98.6 F (37 C)] 98.4 F (36.9 C) (07/30 0535) Pulse Rate:  [65] 65 (07/30 0535) Resp:  [17-18] 17 (07/30 0535) BP: (107-112)/(61-65) 112/65 (07/30 0535) SpO2:  [98 %-100 %] 100 % (07/30 0535) Physical Exam: General: awake and alert, sitting up in chair eating breakfast  Cardiovascular: RRR, no MRG  Respiratory: CTAB, no wheezes, rales or rhonchi  Abdomen: soft, non tender, non distended, bowel sounds x 4 quadrants  Extremities: no edema, non tender   Laboratory:  Recent Labs Lab 01/04/17 0300 01/05/17 2048  01/07/17 1049  WBC 6.7 11.9* 9.3  HGB 8.9* 8.6* 9.4*  HCT 26.8* 25.6* 28.3*  PLT 134* 212 234    Recent Labs Lab 01/04/17 0246  01/05/17 0744  01/07/17 0314 01/08/17 0418 01/09/17 0419  NA 137  < > 138  < > 136 134* 132*  K 5.5*  < > 4.7  < > 4.3 4.2 4.1  CL 110  < > 108  < > 103 99* 96*  CO2 11*  < > 17*  < > 16* 23 22  BUN 212*  < > 169*  < > 130* 81* 99*  CREATININE 13.16*  < > 11.69*  < > 10.02* 7.78* 9.59*  CALCIUM 7.2*  < > 7.1*  < > 7.9* 8.8* 8.8*  PROT  --   --  6.2*  --   --   --   --   BILITOT  --   --  0.6  --   --   --   --   ALKPHOS  --   --  45  --   --   --   --   ALT 14*  --  12*  --   --   --   --   AST  --   --  13*  --   --   --   --   GLUCOSE 121*  < > 110*  < > 88 94 109*  < > =  values in this interval not displayed.   Imaging/Diagnostic Tests: Dg Chest Port 1 View  Result Date: 01/03/2017 CLINICAL DATA:  56 year old male status post dialysis catheter placement EXAM: PORTABLE CHEST 1 VIEW COMPARISON:  Prior chest x-ray 04/19/2016 FINDINGS: Right IJ approach tunneled hemodialysis catheter. The catheter tip projects over the upper right atrium. Stable cardiomegaly and mild pulmonary vascular congestion without overt edema. Inspiratory volumes remain low. Probable small chronic right pleural effusion. No evidence pneumothorax. No acute osseous abnormality. IMPRESSION: 1. The tip of a right IJ approach tunneled hemodialysis catheter overlies the upper right atrium. 2. No evidence pneumothorax. 3. Stable cardiomegaly and vascular congestion without overt edema. Electronically Signed   By: Jacqulynn Cadet M.D.   On: 01/03/2017 15:37   Dg Fluoro Guide Cv Line-no Report  Result Date: 01/03/2017 Fluoroscopy was utilized by the requesting physician.  No radiographic interpretation.     Caroline More, DO 01/10/2017, 12:09 PM PGY-1, [place] Intern pager: [phone], text pages welcome

## 2017-01-10 NOTE — Progress Notes (Signed)
[facility] ROUNDING NOTE   Subjective:   56 yo M with progressive CKD5, now ESRD. Admitted 7/19 with severe dehydration and hypernatremia after being found down x 5 days covered in urine and feces. Improved with IVFs. Initially declined HD, however now willing to proceed. Tolerating dialysis and now s/p 3 HD sessions   Has outpatient slot at [facility]     Doing well this AM, no complaints.   Objective:  Vital signs in last 24 hours:  Temp:  [97.7 F (36.5 C)-98.6 F (37 C)] 98.4 F (36.9 C) (07/30 0535) Pulse Rate:  [65] 65 (07/30 0535) Resp:  [17-18] 17 (07/30 0535) BP: (107-119)/(61-65) 112/65 (07/30 0535) SpO2:  [98 %-100 %] 100 % (07/30 0535)  Weight change:  Filed Weights   01/07/17 0650 01/07/17 1002 01/08/17 0554  Weight: 288 lb 5.8 oz (130.8 kg) 277 lb 5.4 oz (125.8 kg) 280 lb (127 kg)    Intake/Output: No intake/output data recorded.   Intake/Output this shift:  No intake/output data recorded.  CVS- RRR RS- CTA ABD- BS present soft non-distended EXT- no edema   Basic Metabolic Panel:  Recent Labs Lab 01/03/17 1830  01/05/17 2048 01/06/17 0338 01/07/17 0314 01/08/17 0418 01/09/17 0419  NA 138  < > 136 137 136 134* 132*  K 5.5*  < > 5.0 3.9 4.3 4.2 4.1  CL 113*  < > 106 105 103 99* 96*  CO2 10*  < > 15* 20* 16* 23 22  GLUCOSE 127*  < > 117* 95 88 94 109*  BUN 212*  < > 171* 118* 130* 81* 99*  CREATININE 13.09*  < > 11.84* 8.86* 10.02* 7.78* 9.59*  CALCIUM 7.2*  < > 7.0* 7.4* 7.9* 8.8* 8.8*  PHOS 9.6*  --  9.8*  --   --   --   --   < > = values in this interval not displayed.  Liver Function Tests:  Recent Labs Lab 01/03/17 1830 01/04/17 0246 01/05/17 0744 01/05/17 2048  AST  --   --  13*  --   ALT  --  14* 12*  --   ALKPHOS  --   --  45  --   BILITOT  --   --  0.6  --   PROT  --   --  6.2*  --   ALBUMIN 2.5*  --  2.7* 2.8*   No results for input(s): LIPASE, AMYLASE in the last 168 hours. No results  for input(s): AMMONIA in the last 168 hours.  CBC:  Recent Labs Lab 01/03/17 1830 01/04/17 0300 01/05/17 2048 01/07/17 1049  WBC 10.5 6.7 11.9* 9.3  HGB 8.8* 8.9* 8.6* 9.4*  HCT 26.3* 26.8* 25.6* 28.3*  MCV 80.4 81.0 80.0 81.1  PLT 133* 134* 212 234    Cardiac Enzymes: No results for input(s): CKTOTAL, CKMB, CKMBINDEX, TROPONINI in the last 168 hours.  BNP: Invalid input(s): POCBNP  CBG: No results for input(s): GLUCAP in the last 168 hours.  Microbiology: Results for orders placed or performed during the hospital encounter of 12/30/16  Surgical pcr screen     Status: Abnormal   Collection Time: 01/03/17  4:56 AM  Result Value Ref Range Status   MRSA, PCR NEGATIVE NEGATIVE Final   Staphylococcus aureus POSITIVE (A) NEGATIVE Final    Comment:        The Xpert SA Assay (FDA approved for NASAL specimens in patients over 25 years of age), is one component of  a comprehensive surveillance program.  Test performance has been validated by [facility] for patients greater than or equal to 28 year old. It is not intended to diagnose infection nor to guide or monitor treatment.     Coagulation Studies: No results for input(s): LABPROT, INR in the last 72 hours.  Urinalysis: No results for input(s): COLORURINE, LABSPEC, PHURINE, GLUCOSEU, HGBUR, BILIRUBINUR, KETONESUR, PROTEINUR, UROBILINOGEN, NITRITE, LEUKOCYTESUR in the last 72 hours.  Invalid input(s): APPERANCEUR    Imaging: No results found.   Medications:   . sodium chloride    . sodium chloride    . sodium chloride    . sodium chloride    . sodium chloride     . aspirin EC  81 mg Oral Daily  . calcitRIOL  0.25 mcg Oral Daily  . calcium acetate  1,334 mg Oral TID WC  . carvedilol  50 mg Oral BID WC  . heparin  5,000 Units Subcutaneous Q8H  . hydrALAZINE  50 mg Oral TID  . isosorbide mononitrate  60 mg Oral Daily   sodium chloride, sodium chloride, sodium chloride, sodium chloride, acetaminophen  **OR** acetaminophen, alteplase, heparin, heparin, lidocaine (PF), lidocaine-prilocaine, oxyCODONE-acetaminophen, pentafluoroprop-tetrafluoroeth, polyethylene glycol, promethazine   A/P  1. ESRD: S/p right IJ TDC and left radiocephalic AVF per [facility] on 2/08. Intermittent HD initiated 7/23, s/p 3 sessions. Has outpatient HD arragements at [place], TTS shift. Will hold off on dialysis today, Ok for discharge from nephro standpoint as long as patient has transportation arranged to HD tomorrow.  2. Hypernatremia: Resolved with IVFs 3. HTN: Well controlled; continue coreg, hydralazine, and imdur   Velna Ochs, M.D. - PGY2 Pager: [phone] 01/10/2017, 7:46 AM   Patient seen and examined, agree with above note with above modifications. Patient defensive and says he is ready for discharge.  I think the original plan was for him to get one more HD here today then be discharged to go to [place].  He tells me that he has a ride tomorrow and will not have an issue getting to [place] tomorrow so I think is OK for him to be discharged when his ride comes and go to [place] tomorrow- orders have been sent to his OP unit  Corliss Parish, MD 01/10/2017

## 2017-01-11 MED FILL — CALCIUM ACETATE 667 MG CAP: 667 | 30 days supply | Qty: 180 | Fill #0

## 2017-01-12 ENCOUNTER — Telehealth: Payer: Self-pay | Admitting: General Practice

## 2017-01-12 NOTE — Telephone Encounter (Signed)
Call placed to patient [phone]) to inform him the clinic and its services (financial assistance, Education officer, museum and pharmacy). Phone rang but no answer. Call drops after several rings.

## 2017-01-19 ENCOUNTER — Inpatient Hospital Stay (INDEPENDENT_AMBULATORY_CARE_PROVIDER_SITE_OTHER): Payer: Self-pay | Admitting: Physician Assistant

## 2017-01-21 ENCOUNTER — Other Ambulatory Visit: Payer: Self-pay

## 2017-01-21 DIAGNOSIS — Z4931 Encounter for adequacy testing for hemodialysis: Secondary | ICD-10-CM

## 2017-01-24 ENCOUNTER — Encounter: Payer: Self-pay | Admitting: Vascular Surgery

## 2017-01-24 DIAGNOSIS — Z0279 Encounter for issue of other medical certificate: Secondary | ICD-10-CM | POA: Diagnosis not present

## 2017-02-01 ENCOUNTER — Ambulatory Visit (HOSPITAL_COMMUNITY): Payer: PRIVATE HEALTH INSURANCE | Attending: Vascular Surgery

## 2017-02-01 ENCOUNTER — Encounter: Payer: PRIVATE HEALTH INSURANCE | Admitting: Vascular Surgery

## 2017-04-16 IMAGING — US US RENAL
1 series · 14 of 25 positions shown · non-contrast
Comparison: CT abdomen and pelvis October 29, 2012

CLINICAL DATA: Chronic renal failure

EXAM:
RENAL / URINARY TRACT ULTRASOUND COMPLETE

[Series 1: us renal · 0.34mm/px · 14 of 34 slices shown]
[im 1/34]
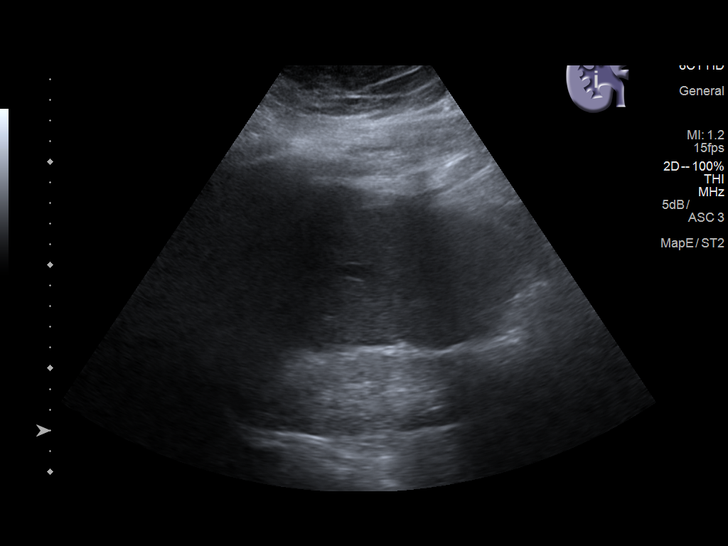
[im 3/34]
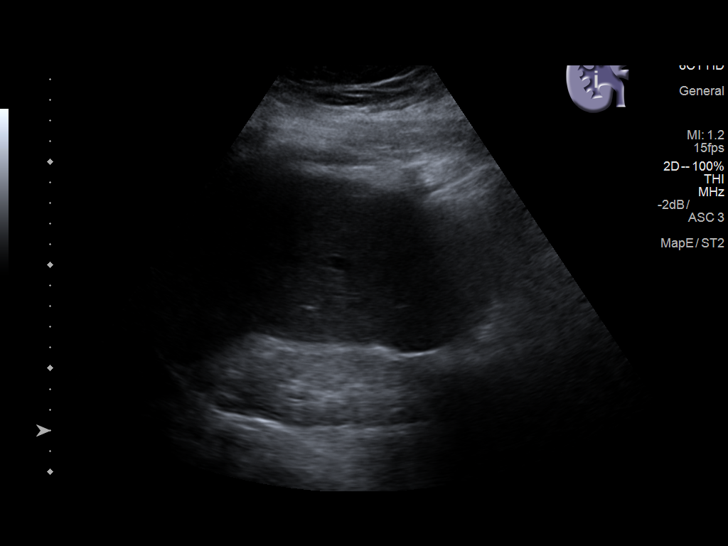
[im 6/34]
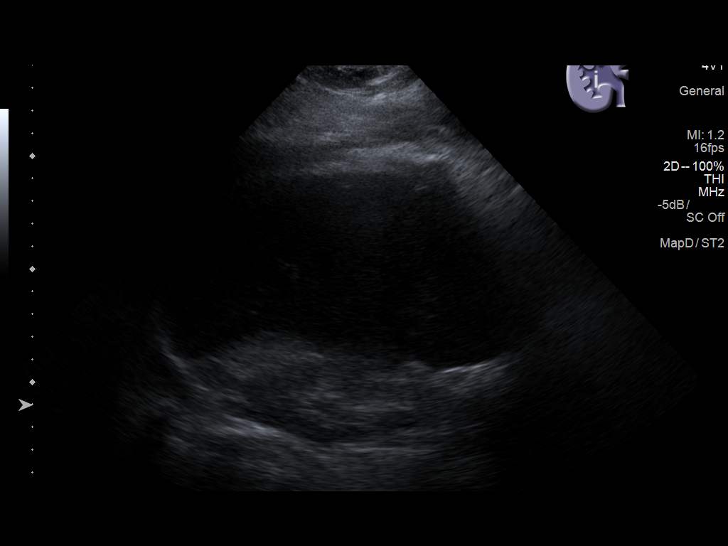
[im 9/34]
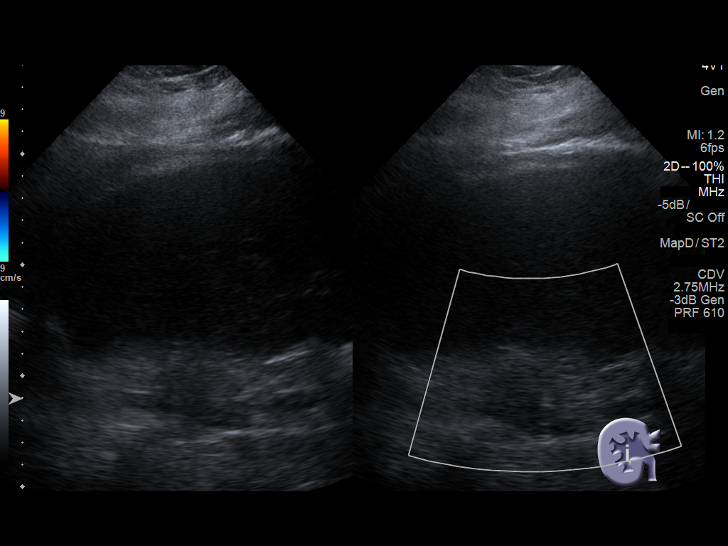
[im 12/34]
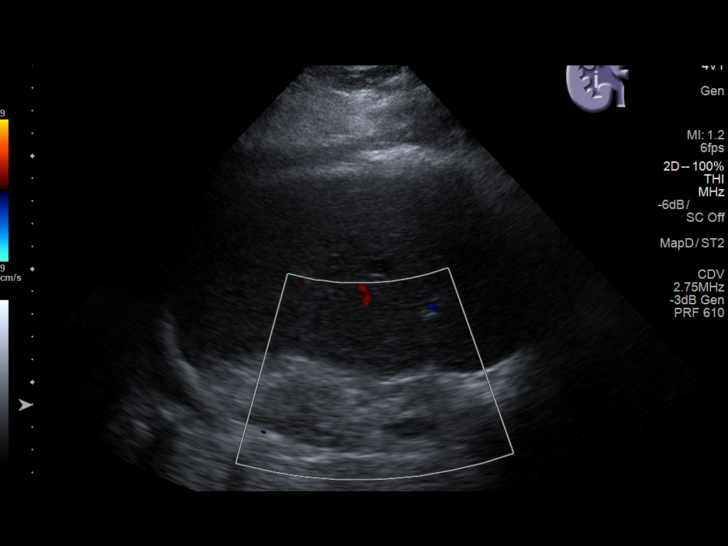
[im 13/34]
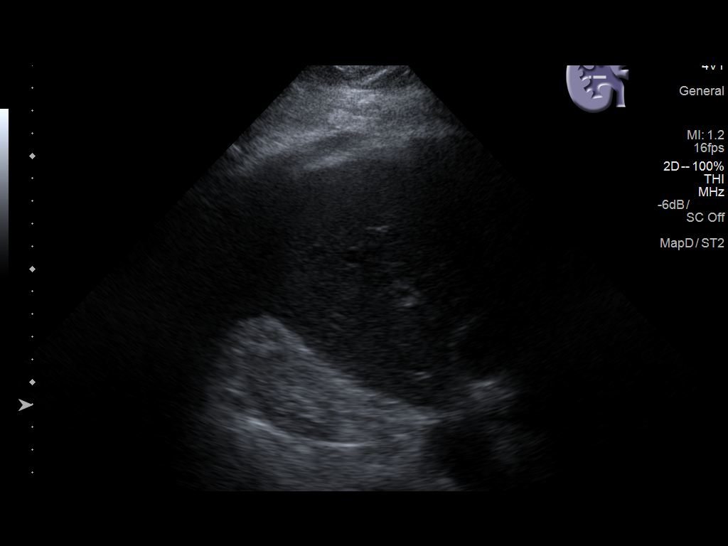
[im 16/34]
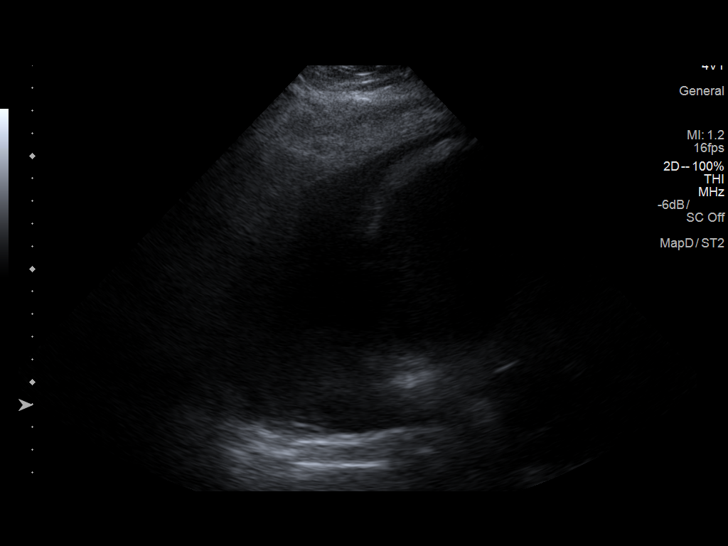
[im 18/34]
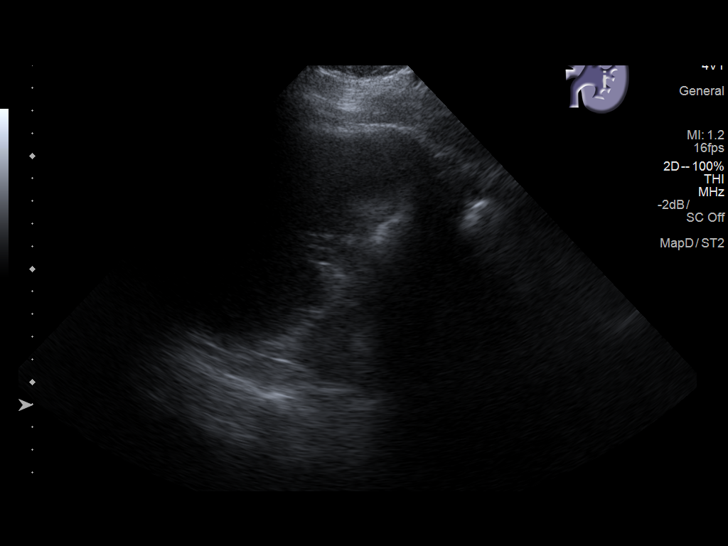
[im 21/34]
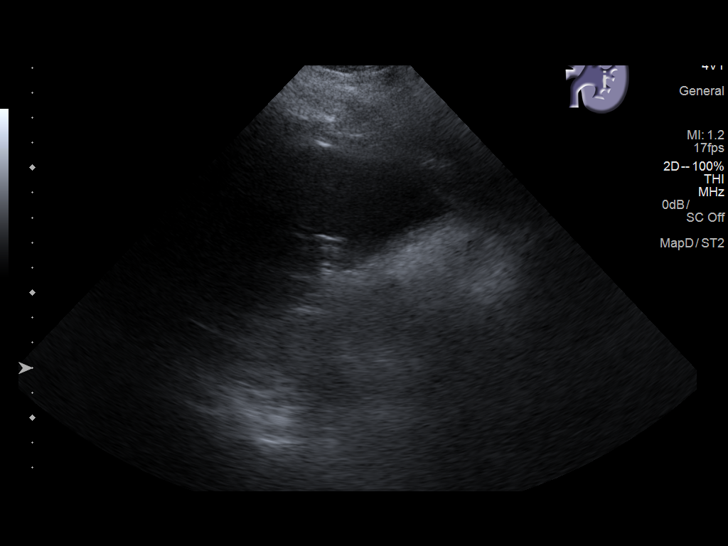
[im 23/34]
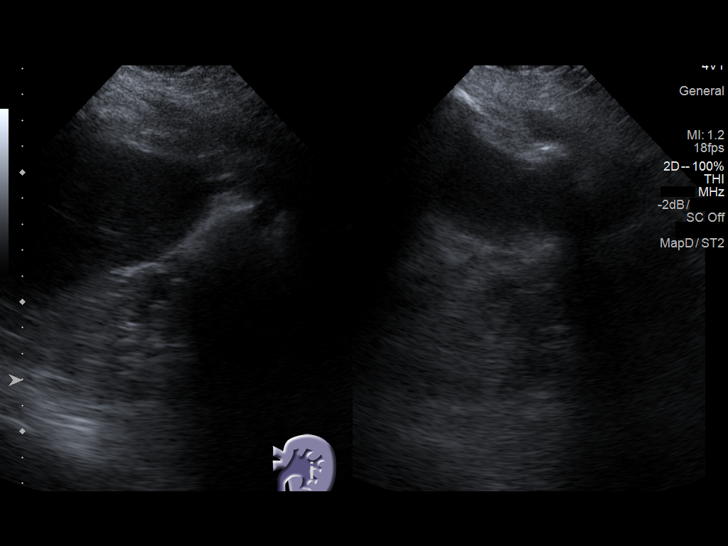
[im 25/34]
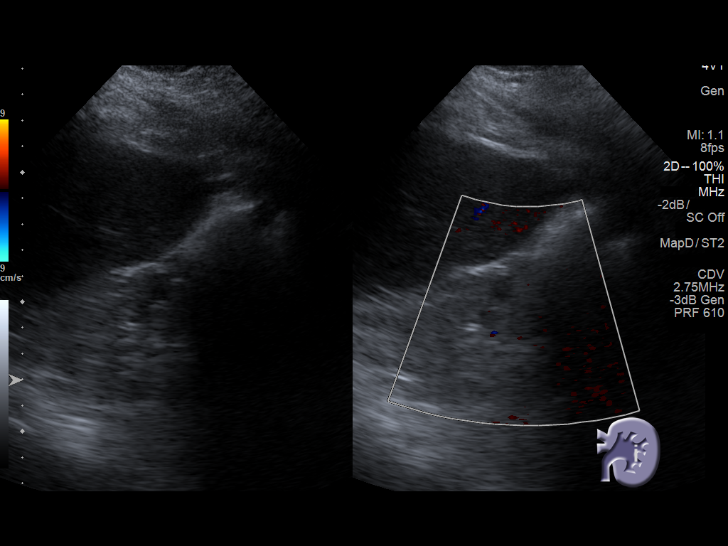
[im 28/34]
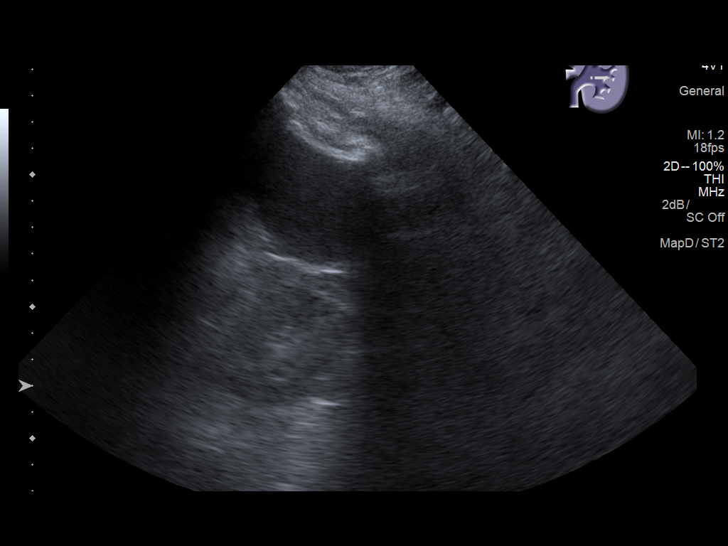
[im 31/34]
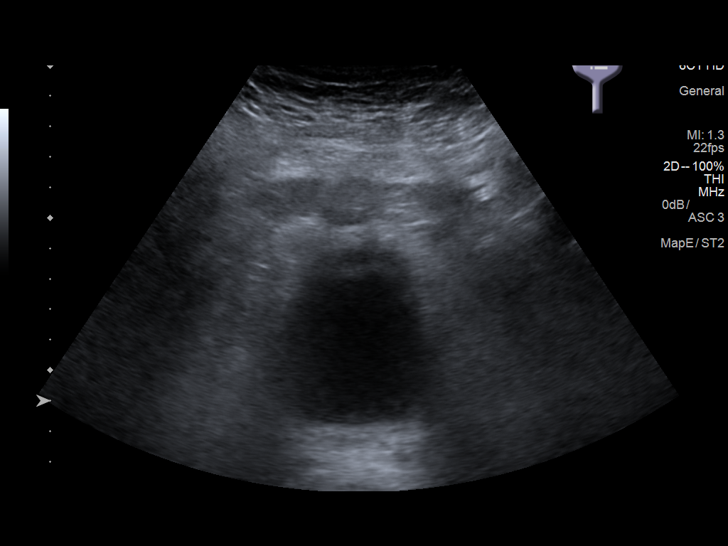
[im 34/34]
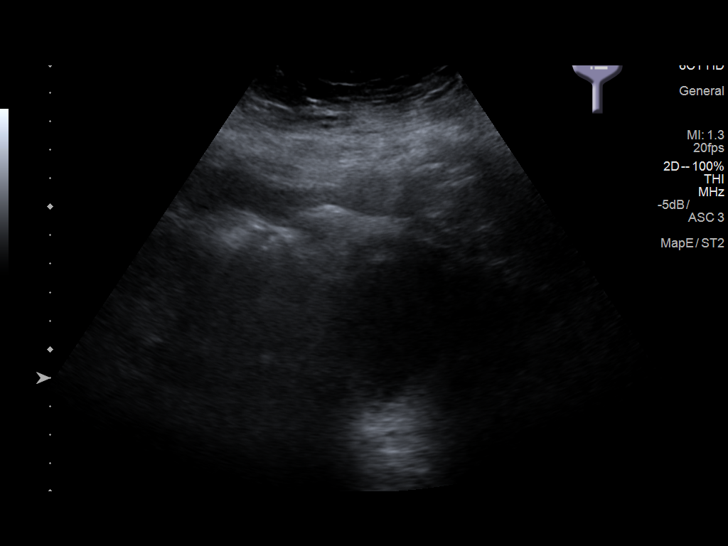

[14 of 25 positions shown; findings below may reference images not displayed]

FINDINGS: Right Kidney:

Length: 11.5 cm. Echogenicity is increased. The renal cortical
thickness is within normal limits. No perinephric fluid or
hydronephrosis visualized. There is a a cyst arising from the upper
pole the right kidney measuring 1.4 x 0.8 x 1.7 cm. A cyst more
inferiorly measures 2.4 x 0.8 x 1.4 cm. No sonographically
demonstrable calculus or ureterectasis.

Left Kidney:

Length: 11.9 cm. Echogenicity is increased. Renal cortical thickness
is normal. There is a cyst in the mid left kidney measuring 0.9 x
1.1 x 1.2 cm. No perinephric fluid or hydronephrosis visualized. No
sonographically demonstrable calculus or ureterectasis.

Bladder:

Appears normal for degree of bladder distention.

There is a right pleural effusion.
IMPRESSION: Kidneys are echogenic consistent with medical renal disease. No
obstructing foci identified in either kidney. There are small renal
cysts bilaterally. There is a right pleural effusion.

## 2019-09-04 ENCOUNTER — Emergency Department (HOSPITAL_COMMUNITY)
Admission: EM | Admit: 2019-09-04 | Discharge: 2019-09-04 | Disposition: A | Discharge status: Home / Self Care | Payer: Medicare (Managed Care) | Attending: Emergency Medicine | Admitting: Emergency Medicine

## 2019-09-04 ENCOUNTER — Emergency Department (HOSPITAL_COMMUNITY): Payer: Medicare (Managed Care)

## 2019-09-04 ENCOUNTER — Other Ambulatory Visit: Payer: Self-pay

## 2019-09-04 DIAGNOSIS — Z992 Dependence on renal dialysis: Secondary | ICD-10-CM | POA: Diagnosis present

## 2019-09-04 DIAGNOSIS — I132 Hypertensive heart and chronic kidney disease with heart failure and with stage 5 chronic kidney disease, or end stage renal disease: Secondary | ICD-10-CM | POA: Insufficient documentation

## 2019-09-04 DIAGNOSIS — N186 End stage renal disease: Secondary | ICD-10-CM | POA: Diagnosis not present

## 2019-09-04 DIAGNOSIS — I5022 Chronic systolic (congestive) heart failure: Secondary | ICD-10-CM | POA: Insufficient documentation

## 2019-09-04 LAB — CBC
HCT: 33.3 % — ABNORMAL LOW (ref 39.0–52.0)
Hemoglobin: 10.8 g/dL — ABNORMAL LOW (ref 13.0–17.0)
MCH: 30.9 pg (ref 26.0–34.0)
MCHC: 32.4 g/dL (ref 30.0–36.0)
MCV: 95.4 fL (ref 80.0–100.0)
Platelets: 163 10*3/uL (ref 150–400)
RBC: 3.49 MIL/uL — ABNORMAL LOW (ref 4.22–5.81)
RDW: 13.2 % (ref 11.5–15.5)
WBC: 6.2 10*3/uL (ref 4.0–10.5)
nRBC: 0 % (ref 0.0–0.2)

## 2019-09-04 LAB — BASIC METABOLIC PANEL
Anion gap: 18 — ABNORMAL HIGH (ref 5–15)
BUN: 76 mg/dL — ABNORMAL HIGH (ref 6–20)
CO2: 26 mmol/L (ref 22–32)
Calcium: 8.4 mg/dL — ABNORMAL LOW (ref 8.9–10.3)
Chloride: 96 mmol/L — ABNORMAL LOW (ref 98–111)
Creatinine, Ser: 18.85 mg/dL — ABNORMAL HIGH (ref 0.61–1.24)
GFR calc Af Amer: 3 mL/min — ABNORMAL LOW (ref >60)
GFR calc non Af Amer: 2 mL/min — ABNORMAL LOW (ref >60)
Glucose, Bld: 98 mg/dL (ref 70–99)
Potassium: 5.1 mmol/L (ref 3.5–5.1)
Sodium: 140 mmol/L (ref 135–145)

## 2019-09-04 NOTE — ED Triage Notes (Signed)
Pt here stating he needs dialysis. T, Th, S dialysis patient, last dialyzed on Friday before he moved back here. Endorses mild shob.

## 2019-09-04 NOTE — ED Notes (Signed)
At discharge, pt sitting on stool unhooked from monitor, appeared anxious to leave and did not appear willing to wait for discharge vital signs. Left ER in NAD, discharge instructions given

## 2019-09-04 NOTE — ED Provider Notes (Signed)
[facility] EMERGENCY DEPARTMENT Provider Note   CSN: [id number] Arrival date & time: 09/04/19  1241     History No chief complaint on file.   Edward Jordan is a 59 y.o. male.  Patient recently relocated from [place] to [place]. History of ESRD on dialysis. Last dialysis on 08/31/19. Normal dialysis schedule T/Th/S.  Patient is in process of establishing dialysis center in this area. He is requesting dialysis today. Endorses occasional shortness of breath. Not currently dyspneic.   The history is provided by the patient. No language interpreter was used.       Past Medical History:  Diagnosis Date  . Hypertension     Patient Active Problem List   Diagnosis Date Noted  . ESRD on dialysis ([place])   . Skin breakdown   . Weakness   . Status post insertion of dialysis catheter ([place])   . Surgery, elective   . Hyperkalemia   . Essential hypertension   . Acute renal failure (ARF) ([place]) 04/19/2016  . Acute systolic congestive heart failure ([place]) 04/19/2016  . Acute on chronic systolic CHF (congestive heart failure) ([place]) 07/02/2011  . Accelerated hypertension 07/01/2011  . CKD (chronic kidney disease), stage III 07/01/2011  . Morbid obesity ([place]) 07/01/2011  . Hyperglycemia 07/01/2011  . Elevated CK 07/01/2011  . Hyperlipidemia 07/01/2011    Past Surgical History:  Procedure Laterality Date  . AV FISTULA PLACEMENT Left 01/03/2017   Procedure: ARTERIOVENOUS (AV) FISTULA CREATION;  Surgeon: Rosetta Posner, MD;  Location: [facility];  Service: Vascular;  Laterality: Left;  . INSERTION OF DIALYSIS CATHETER Right 01/03/2017   Procedure: INSERTION OF DIALYSIS CATHETER Right Interanl Jugular Placement;  Surgeon: Rosetta Posner, MD;  Location: [facility] OR;  Service: Vascular;  Laterality: Right;  Right Interanl Jugular Placement  . NO PAST SURGERIES         No family history on file.  Social History   Tobacco Use  . Smoking status: Never Smoker  . Smokeless  tobacco: Never Used  Substance Use Topics  . Alcohol use: Yes    Comment: ocassionally  . Drug use: No    Home Medications Prior to Admission medications   Medication Sig Start Date End Date Taking? Authorizing Provider  aspirin EC 81 MG EC tablet Take 1 tablet (81 mg total) by mouth daily. Patient not taking: Reported on 12/30/2016 04/24/16   Verlee Monte, MD  calcitRIOL (ROCALTROL) 0.25 MCG capsule Take 1 capsule (0.25 mcg total) by mouth daily. Patient not taking: Reported on 12/30/2016 04/24/16   Verlee Monte, MD  calcium acetate (PHOSLO) 667 MG capsule Take 2 capsules (1,334 mg total) by mouth 3 (three) times daily with meals. 01/10/17   Caroline More, DO  carvedilol (COREG) 25 MG tablet Take 2 tablets (50 mg total) by mouth 2 (two) times daily with a meal. 04/24/16   Verlee Monte, MD  hydrALAZINE (APRESOLINE) 100 MG tablet Take 1 tablet (100 mg total) by mouth 3 (three) times daily. 04/24/16   Verlee Monte, MD  isosorbide mononitrate (IMDUR) 60 MG 24 hr tablet Take 1 tablet (60 mg total) by mouth daily. 04/24/16   Verlee Monte, MD  oxyCODONE-acetaminophen (PERCOCET/ROXICET) 5-325 MG per tablet Take 2 tablets by mouth every 4 (four) hours as needed for pain. Patient not taking: Reported on 04/19/2016 10/29/12   Ezequiel Essex, MD    Allergies    Patient has no known allergies.  Review of Systems   Review of Systems  All other systems  reviewed and are negative.   Physical Exam Updated Vital Signs BP (!) 122/97 (BP Location: Right Arm)   Pulse 80   Temp 98.7 F (37.1 C) (Oral)   Resp 16   SpO2 97%   Physical Exam Vitals and nursing note reviewed.  Constitutional:      General: He is not in acute distress.    Appearance: Normal appearance. He is not ill-appearing.  HENT:     Mouth/Throat:     Mouth: Mucous membranes are moist.  Eyes:     Conjunctiva/sclera: Conjunctivae normal.  Cardiovascular:     Rate and Rhythm: Normal rate and regular rhythm.  Pulmonary:      Effort: Pulmonary effort is normal.     Breath sounds: Normal breath sounds.  Abdominal:     Palpations: Abdomen is soft.  Musculoskeletal:     Cervical back: Neck supple.     Right lower leg: No edema.     Left lower leg: No edema.  Skin:    General: Skin is warm and dry.  Neurological:     Mental Status: He is alert and oriented to person, place, and time.  Psychiatric:        Behavior: Behavior normal.     ED Results / Procedures / Treatments   Labs (all labs ordered are listed, but only abnormal results are displayed) Labs Reviewed  CBC - Abnormal; Notable for the following components:      Result Value   RBC 3.49 (*)    Hemoglobin 10.8 (*)    HCT 33.3 (*)    All other components within normal limits  BASIC METABOLIC PANEL - Abnormal; Notable for the following components:   Chloride 96 (*)    BUN 76 (*)    Creatinine, Ser 18.85 (*)    Calcium 8.4 (*)    GFR calc non Af Amer 2 (*)    GFR calc Af Amer 3 (*)    Anion gap 18 (*)    All other components within normal limits    EKG EKG Interpretation  Date/Time:  Tuesday September 04 2019 12:50:09 EDT Ventricular Rate:  76 PR Interval:  184 QRS Duration: 84 QT Interval:  386 QTC Calculation: 434 R Axis:   -36 Text Interpretation: Normal sinus rhythm Left axis deviation Cannot rule out Anterior infarct , age undetermined No significant change since last tracing Confirmed by Blanchie Dessert [phone]) on 09/04/2019 4:13:08 PM   Radiology DG Chest 2 View  Result Date: 09/04/2019 CLINICAL DATA:  Shortness of breath EXAM: CHEST - 2 VIEW COMPARISON:  2018 FINDINGS: Very low lung volumes. No consolidation or edema. No pleural effusion or pneumothorax. Stable cardiomegaly. No acute osseous abnormality. IMPRESSION: Hypoinflation.  Cardiomegaly.  No acute abnormality. Electronically Signed   By: Macy Mis M.D.   On: 09/04/2019 14:13    Procedures Procedures (including critical care time)  Medications Ordered in  ED Medications - No data to display  ED Course  I have reviewed the triage vital signs and the nursing notes.  Pertinent labs & imaging results that were available during my care of the patient were reviewed by me and considered in my medical decision making (see chart for details).   Spoke with nephrology. They will see patient and recommend disposition.  Patient seen by nephrology. He may be discharged home with dialysis as scheduled on Thursday. MDM Rules/Calculators/A&P  Patient is ESRD on dialysis. Labs reviewed. Potassium 5.1. BUN/Creatinine consistent with known renal disease. Patient is not acutely short of breath. Patient seen by nephrology. May discharge home with dialysis on Thursday. Patient agrees with plan. Patient appears safe to discharge at this time.   Final Clinical Impression(s) / ED Diagnoses Final diagnoses:  ESRD (end stage renal disease) on dialysis [facility])    Rx / DC Orders ED Discharge Orders    None       Etta Quill, NP 09/04/19 2050    Blanchie Dessert, MD 09/05/19 1537

## 2019-09-04 NOTE — Discharge Instructions (Signed)
Dialysis on Thursday as planned.

## 2022-01-05 IMAGING — DX DG CHEST 2V
2 series · 2 of 2 positions shown · non-contrast
Comparison: 1206

CLINICAL DATA: Shortness of breath

EXAM:
CHEST - 2 VIEW

[chest pa]
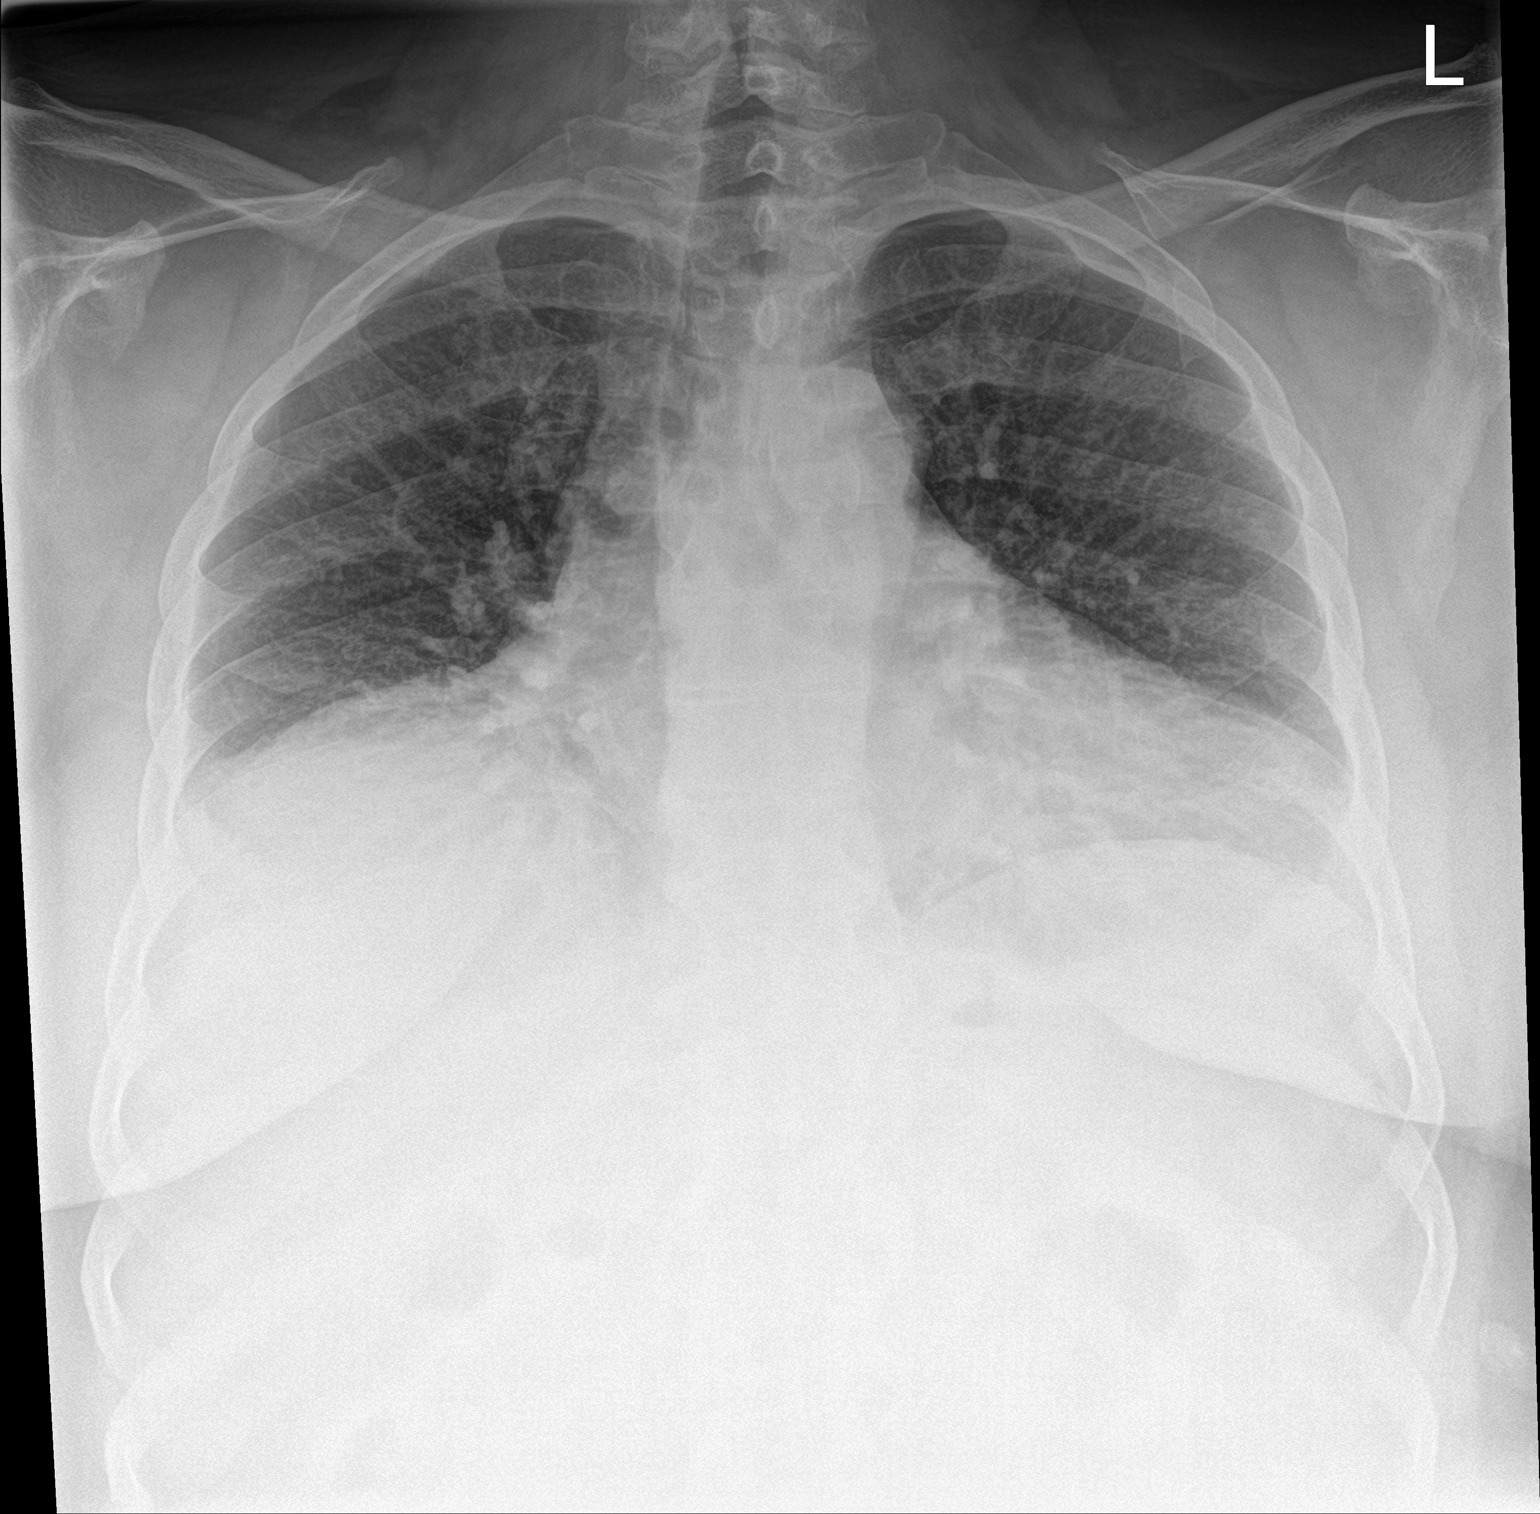

[chest lat]
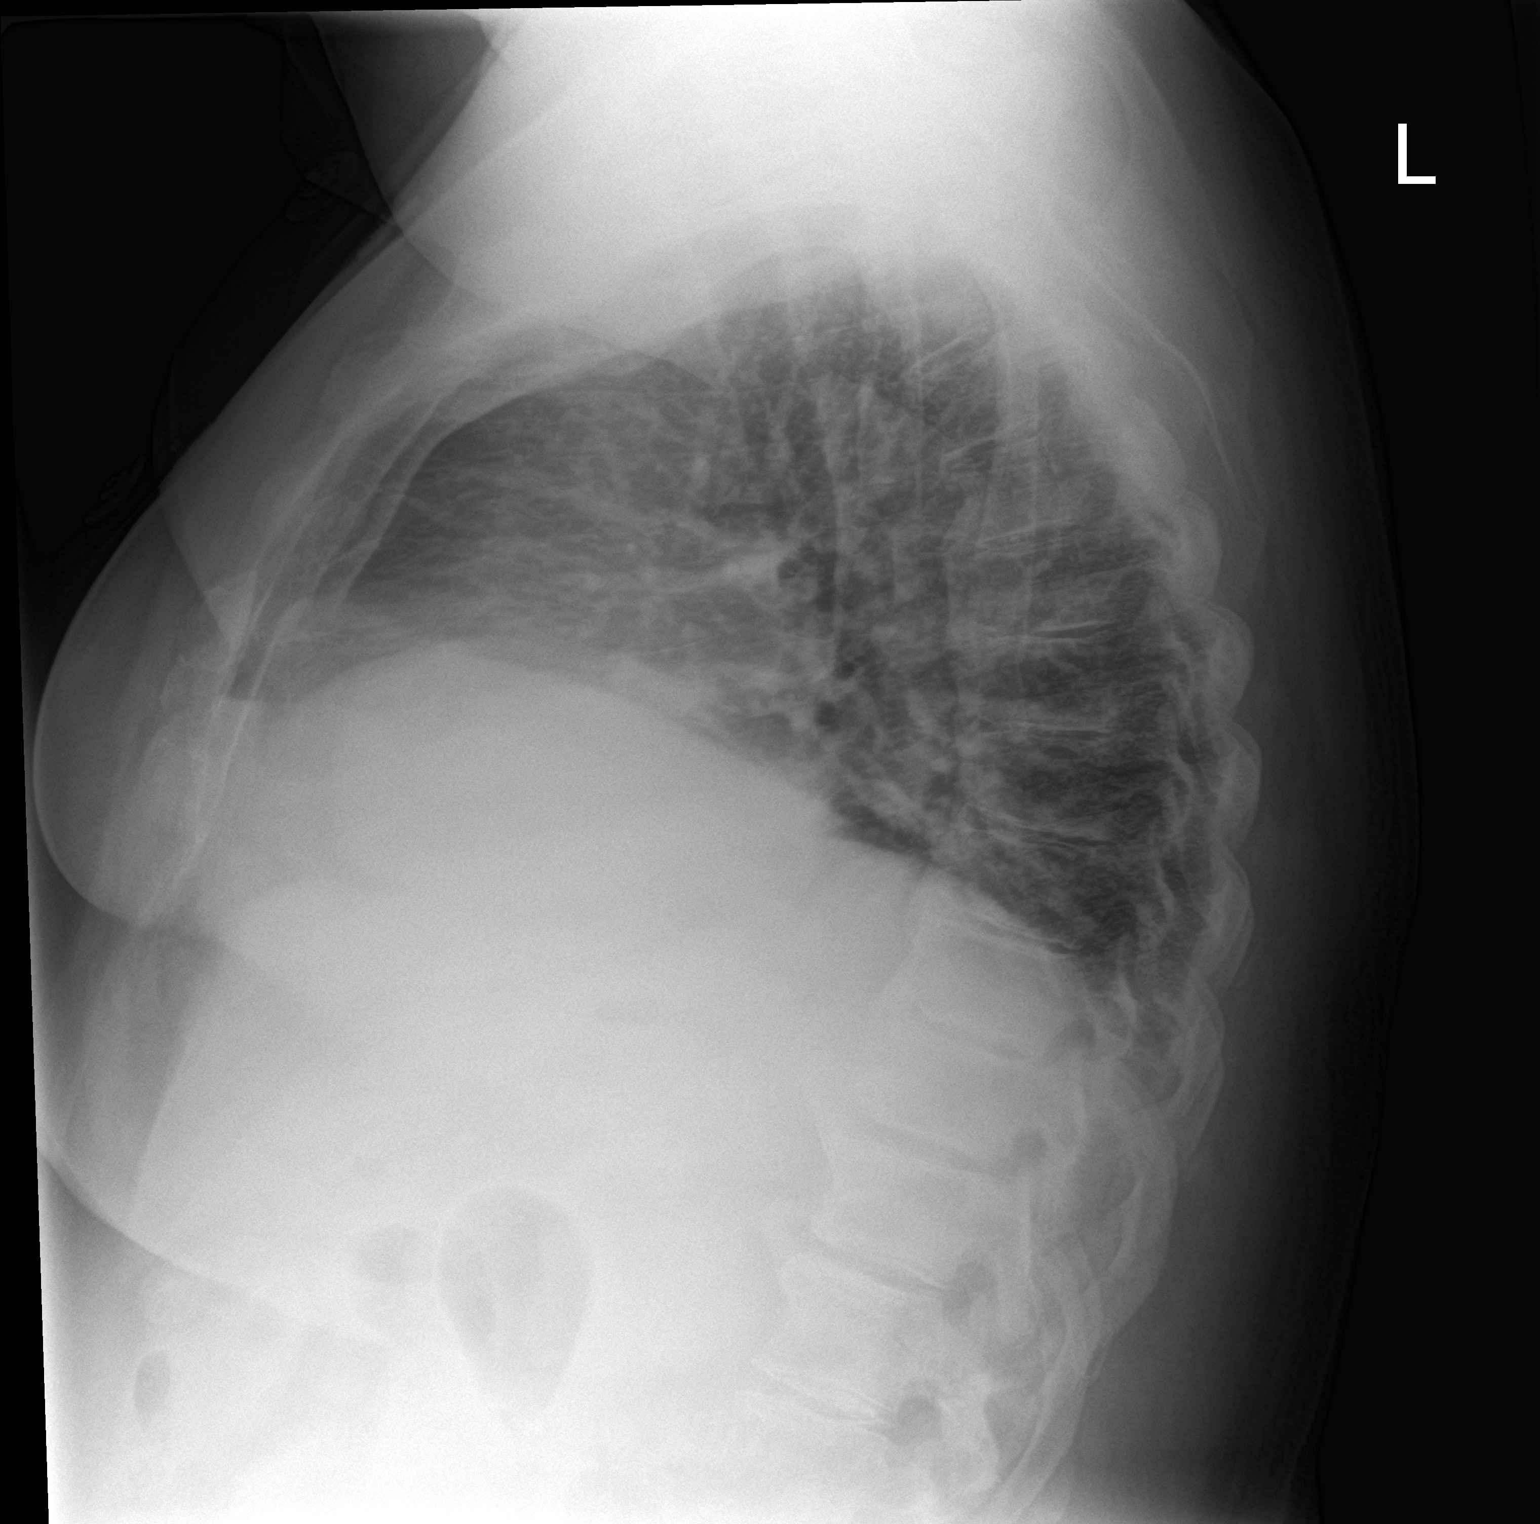

[2 of 2 positions shown; findings below may reference images not displayed]

FINDINGS: Very low lung volumes. No consolidation or edema. No pleural
effusion or pneumothorax. Stable cardiomegaly. No acute osseous
abnormality.
IMPRESSION: Hypoinflation.  Cardiomegaly.  No acute abnormality.
